# Patient Record
Sex: Female | Born: 2000 | Race: White | Hispanic: No | Marital: Single | Smoking: Never smoker
Health system: Southern US, Community
[De-identification: ages and names within clinical notes are randomized; demographics above are authoritative.]

## PROBLEM LIST (undated history)

## (undated) DIAGNOSIS — J45909 Unspecified asthma, uncomplicated: Secondary | ICD-10-CM

## (undated) DIAGNOSIS — T7840XA Allergy, unspecified, initial encounter: Secondary | ICD-10-CM

## (undated) DIAGNOSIS — D649 Anemia, unspecified: Secondary | ICD-10-CM

## (undated) DIAGNOSIS — Z8619 Personal history of other infectious and parasitic diseases: Secondary | ICD-10-CM

## (undated) HISTORY — DX: Anemia, unspecified: D64.9

## (undated) HISTORY — DX: Allergy, unspecified, initial encounter: T78.40XA

## (undated) HISTORY — DX: Personal history of other infectious and parasitic diseases: Z86.19

---
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
## 2021-01-22 ENCOUNTER — Ambulatory Visit (INDEPENDENT_AMBULATORY_CARE_PROVIDER_SITE_OTHER): Payer: BC Managed Care – PPO | Admitting: Physician Assistant

## 2021-01-22 ENCOUNTER — Other Ambulatory Visit: Payer: Self-pay

## 2021-01-22 ENCOUNTER — Encounter: Payer: Self-pay | Admitting: Physician Assistant

## 2021-01-22 VITALS — BP 107/72 | HR 67 | Temp 98.0°F | Ht 63.0 in | Wt 128.2 lb

## 2021-01-22 DIAGNOSIS — Z30011 Encounter for initial prescription of contraceptive pills: Secondary | ICD-10-CM

## 2021-01-22 MED ORDER — NORETHINDRONE ACET-ETHINYL EST 1-20 MG-MCG PO TABS: 1.0000 | ORAL_TABLET | Freq: Every day | ORAL | 11 refills | Status: DC

## 2021-01-22 NOTE — Progress Notes (Signed)
Subjective:    Patient ID: Carol Brown, female    DOB: [birth year], 20 y.o.   MRN: [id number]  Chief Complaint  Patient presents with   Establish Care    HPI 20 y.o. patient presents today for new patient establishment with me.  Patient was previously established with Dr. Artis Flock. 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 Studying business Merchant navy officer.   Current Care Team: No specialists   Acute Concerns: Interested in starting on birth control pill. She is not sexually active at this time. LMP was 01/08/21. No irregularity. No major issues with periods. Does not smoke.   Chronic Concerns: No major medical history    History reviewed. No pertinent past medical history.  History reviewed. No pertinent surgical history.  History reviewed. No pertinent family history.  Social History   Tobacco Use   Smoking status: Never   Smokeless tobacco: Never  Substance Use Topics   Alcohol use: Never   Drug use: Never     No Known Allergies  Review of Systems NEGATIVE UNLESS OTHERWISE INDICATED IN HPI      Objective:     BP 107/72    Pulse 67    Temp 98 F (36.7 C)    Ht 5\' 3"  (1.6 m)    Wt 128 lb 3.2 oz (58.2 kg)    LMP 01/08/2021    SpO2 99%    BMI 22.71 kg/m   Wt Readings from Last 3 Encounters:  01/22/21 128 lb 3.2 oz (58.2 kg) (52 %, Z= 0.04)*  06/26/20 119 lb 9.6 oz (54.3 kg) (37 %, Z= -0.33)*   * Growth percentiles are based on CDC (Girls, 2-20 Years) data.    BP Readings from Last 3 Encounters:  01/22/21 107/72  06/26/20 107/74     Physical Exam Vitals and nursing note reviewed.  Constitutional:      Appearance: Normal appearance. She is normal weight. She is not toxic-appearing.  HENT:     Head: Normocephalic and atraumatic.     Right Ear: External ear normal.     Left Ear: External ear normal.     Nose: Nose normal.     Mouth/Throat:     Mouth: Mucous membranes are moist.  Eyes:     Extraocular Movements: Extraocular  movements intact.     Conjunctiva/sclera: Conjunctivae normal.     Pupils: Pupils are equal, round, and reactive to light.  Cardiovascular:     Rate and Rhythm: Normal rate and regular rhythm.     Pulses: Normal pulses.     Heart sounds: Normal heart sounds.  Pulmonary:     Effort: Pulmonary effort is normal.     Breath sounds: Normal breath sounds.  Musculoskeletal:        General: Normal range of motion.     Cervical back: Normal range of motion and neck supple.  Skin:    General: Skin is warm and dry.  Neurological:     General: No focal deficit present.     Mental Status: She is alert and oriented to person, place, and time.  Psychiatric:        Mood and Affect: Mood normal.        Behavior: Behavior normal.        Thought Content: Thought content normal.        Judgment: Judgment normal.       Assessment & Plan:   Problem List Items Addressed This Visit   None Visit Diagnoses  Oral contraception initiation    -  Primary        Meds ordered this encounter  Medications   norethindrone-ethinyl estradiol (LOESTRIN 1/20, 21,) 1-20 MG-MCG tablet    Sig: Take 1 tablet by mouth daily.    Dispense:  28 tablet    Refill:  11   1. Oral contraception initiation -Several options discussed with patient including IUD, depo shot, Nexplanon, and OCPs. -R/B/A discussed with patient. Possible SE also discussed. -Agreed to start on Loestrin. -Reminded her not to miss doses. Reminded her this does not protect against STIs.  -An After Visit Summary was printed and given to the patient. -F/up in 1 year for labs / CPE / med check ; or prn.    Rithik Odea M Kenedee Molesky, PA-C

## 2021-01-22 NOTE — Patient Instructions (Signed)
Good to meet you today! Start on the Loestrin this upcoming Sunday. Take daily at the same time. Do not miss doses. See handout provided. Call / Mychart if any concerns.

## 2021-05-10 DIAGNOSIS — J209 Acute bronchitis, unspecified: Secondary | ICD-10-CM | POA: Diagnosis not present

## 2021-05-10 DIAGNOSIS — J45998 Other asthma: Secondary | ICD-10-CM | POA: Diagnosis not present

## 2021-07-03 ENCOUNTER — Telehealth: Payer: Self-pay | Admitting: Physician Assistant

## 2021-07-03 ENCOUNTER — Encounter: Payer: Self-pay | Admitting: Family Medicine

## 2021-07-03 ENCOUNTER — Emergency Department (HOSPITAL_BASED_OUTPATIENT_CLINIC_OR_DEPARTMENT_OTHER): Payer: BC Managed Care – PPO

## 2021-07-03 ENCOUNTER — Encounter (HOSPITAL_BASED_OUTPATIENT_CLINIC_OR_DEPARTMENT_OTHER): Payer: Self-pay

## 2021-07-03 ENCOUNTER — Other Ambulatory Visit: Payer: Self-pay

## 2021-07-03 ENCOUNTER — Emergency Department (HOSPITAL_BASED_OUTPATIENT_CLINIC_OR_DEPARTMENT_OTHER)
Admission: EM | Admit: 2021-07-03 | Discharge: 2021-07-03 | Disposition: A | Discharge status: Home / Self Care | Payer: BC Managed Care – PPO | Attending: Emergency Medicine | Admitting: Emergency Medicine

## 2021-07-03 ENCOUNTER — Ambulatory Visit (INDEPENDENT_AMBULATORY_CARE_PROVIDER_SITE_OTHER): Payer: BC Managed Care – PPO | Admitting: Family Medicine

## 2021-07-03 VITALS — BP 102/76 | HR 100 | Temp 98.3°F | Ht 63.0 in | Wt 132.1 lb

## 2021-07-03 DIAGNOSIS — R1084 Generalized abdominal pain: Secondary | ICD-10-CM | POA: Diagnosis not present

## 2021-07-03 DIAGNOSIS — A084 Viral intestinal infection, unspecified: Secondary | ICD-10-CM | POA: Diagnosis not present

## 2021-07-03 DIAGNOSIS — R109 Unspecified abdominal pain: Secondary | ICD-10-CM | POA: Diagnosis not present

## 2021-07-03 DIAGNOSIS — R000 Tachycardia, unspecified: Secondary | ICD-10-CM | POA: Insufficient documentation

## 2021-07-03 DIAGNOSIS — R1031 Right lower quadrant pain: Secondary | ICD-10-CM | POA: Diagnosis not present

## 2021-07-03 DIAGNOSIS — R1030 Lower abdominal pain, unspecified: Secondary | ICD-10-CM

## 2021-07-03 LAB — POCT URINALYSIS DIPSTICK
Bilirubin, UA: POSITIVE
Blood, UA: NEGATIVE
Glucose, UA: NEGATIVE
Ketones, UA: POSITIVE
Nitrite, UA: NEGATIVE
Protein, UA: POSITIVE — AB
Spec Grav, UA: 1.02 (ref 1.010–1.025)
Urobilinogen, UA: 0.2 E.U./dL
pH, UA: 6 (ref 5.0–8.0)

## 2021-07-03 LAB — URINALYSIS, ROUTINE W REFLEX MICROSCOPIC
Bilirubin Urine: NEGATIVE
Glucose, UA: NEGATIVE mg/dL
Hgb urine dipstick: NEGATIVE
Ketones, ur: 40 mg/dL — AB
Nitrite: NEGATIVE
Protein, ur: NEGATIVE mg/dL
Specific Gravity, Urine: 1.011 (ref 1.005–1.030)
pH: 6.5 (ref 5.0–8.0)

## 2021-07-03 LAB — COMPREHENSIVE METABOLIC PANEL
ALT: 9 U/L (ref 0–44)
AST: 17 U/L (ref 15–41)
Albumin: 4.7 g/dL (ref 3.5–5.0)
Alkaline Phosphatase: 39 U/L (ref 38–126)
Anion gap: 11 (ref 5–15)
BUN: 8 mg/dL (ref 6–20)
CO2: 24 mmol/L (ref 22–32)
Calcium: 9.3 mg/dL (ref 8.9–10.3)
Chloride: 102 mmol/L (ref 98–111)
Creatinine, Ser: 0.67 mg/dL (ref 0.44–1.00)
GFR, Estimated: >60 mL/min (ref >60)
Glucose, Bld: 88 mg/dL (ref 70–99)
Potassium: 3.6 mmol/L (ref 3.5–5.1)
Sodium: 137 mmol/L (ref 135–145)
Total Bilirubin: 0.5 mg/dL (ref 0.3–1.2)
Total Protein: 8 g/dL (ref 6.5–8.1)

## 2021-07-03 LAB — CBC
HCT: 39.1 % (ref 36.0–46.0)
Hemoglobin: 12.4 g/dL (ref 12.0–15.0)
MCH: 24.9 pg — ABNORMAL LOW (ref 26.0–34.0)
MCHC: 31.7 g/dL (ref 30.0–36.0)
MCV: 78.5 fL — ABNORMAL LOW (ref 80.0–100.0)
Platelets: 224 10*3/uL (ref 150–400)
RBC: 4.98 MIL/uL (ref 3.87–5.11)
RDW: 14.4 % (ref 11.5–15.5)
WBC: 8.8 10*3/uL (ref 4.0–10.5)
nRBC: 0 % (ref 0.0–0.2)

## 2021-07-03 LAB — WET PREP, GENITAL
Clue Cells Wet Prep HPF POC: NONE SEEN
Sperm: NONE SEEN
Trich, Wet Prep: NONE SEEN
WBC, Wet Prep HPF POC: >=10 — AB (ref <10)
Yeast Wet Prep HPF POC: NONE SEEN

## 2021-07-03 LAB — LIPASE, BLOOD: Lipase: 14 U/L (ref 11–51)

## 2021-07-03 LAB — POCT URINE PREGNANCY: Preg Test, Ur: NEGATIVE

## 2021-07-03 MED ORDER — KETOROLAC TROMETHAMINE 15 MG/ML IJ SOLN
15.0000 mg | Freq: Once | INTRAMUSCULAR | Status: AC
Start: 2021-07-03 — End: 2021-07-03
  Administered 2021-07-03: 15 mg via INTRAVENOUS
  Filled 2021-07-03: qty 1

## 2021-07-03 MED ORDER — IOHEXOL 300 MG/ML  SOLN
75.0000 mL | Freq: Once | INTRAMUSCULAR | Status: AC | PRN
  Administered 2021-07-03: 75 mL via INTRAVENOUS

## 2021-07-03 MED ORDER — SODIUM CHLORIDE 0.9 % IV BOLUS
1000.0000 mL | Freq: Once | INTRAVENOUS | Status: AC
  Administered 2021-07-03: 1000 mL via INTRAVENOUS

## 2021-07-03 MED ORDER — ONDANSETRON HCL 4 MG PO TABS: 4.0000 mg | ORAL_TABLET | Freq: Four times a day (QID) | ORAL | 0 refills | Status: DC

## 2021-07-03 NOTE — ED Triage Notes (Signed)
Sent by MD for CT scan to rule out appendices.  RLQ pain onset two days ago.  Fever  no vomiting.  Diarrhea.  NAD at present

## 2021-07-03 NOTE — Patient Instructions (Signed)
It was very nice to see you today!  Go to ER   PLEASE NOTE:  If you had any lab tests please let us know if you have not heard back within a few days. You may see your results on MyChart before we have a chance to review them but we will give you a call once they are reviewed by [place]. If we ordered any referrals today, please let us know if you have not heard from their office within the next week.   Please try these tips to maintain a healthy lifestyle:  Eat most of your calories during the day when you are active. Eliminate processed foods including packaged sweets (pies, cakes, cookies), reduce intake of potatoes, white bread, white pasta, and white rice. Look for whole grain options, oat flour or almond flour.  Each meal should contain half fruits/vegetables, one quarter protein, and one quarter carbs (no bigger than a computer mouse).  Cut down on sweet beverages. This includes juice, soda, and sweet tea. Also watch fruit intake, though this is a healthier sweet option, it still contains natural sugar! Limit to 3 servings daily.  Drink at least 1 glass of water with each meal and aim for at least 8 glasses per day  Exercise at least 150 minutes every week.

## 2021-07-03 NOTE — Telephone Encounter (Signed)
Patient was triaged- patient seeing [facility] 07/03/21 -   Patient Name: Carol Brown Gender: Female DOB: 09-19-2000 Age: 21 Y 10 M 13 D Return Phone Number: [phone] (Primary) Address: City/ State/ Zip: [place]  [address] Client [facility] [employer] [facility] Type Call Who Is Calling Patient / Member / Family / Caregiver Call Type Triage / Clinical Relationship To Patient Mother Return Phone Number [phone] (Primary) Chief Complaint Abdominal Pain Reason for Call Symptomatic / Request for Health Information Initial Comment Caller states her daughter has been having pain on right side by her ovary for last day and a half with diarrhea. She also has loss of appetite. She was thinking it may be an ovarian cyst or her period. Denies severe pain but it hurts while walking. Translation No Nurse Assessment Nurse: Alexander Mt, RN, Nicholaus Bloom Date/Time (Eastern Time): 07/03/2021 7:01:26 AM Confirm and document reason for call. If symptomatic, describe symptoms. ---Caller states her daughter has been having pain on right side by her ovary for last day and a half with diarrhea. She also has loss of appetite and bloating. She was thinking it may be an ovarian cyst or her period. LMP was one month ago. Pain is 8/10 at worst. Temp yesterday was 102. Pain is in the right lower quad. She has also been nauseated. Does the patient have any new or worsening symptoms? ---Yes Will a triage be completed? ---Yes Related visit to physician within the last 2 weeks? ---No Does the PT have any chronic conditions? (i.e. diabetes, asthma, this includes High risk factors for pregnancy, etc.) ---No Is the patient pregnant or possibly pregnant? (Ask all females between the ages of 61-55) ---No Is this a behavioral health or substance abuse call? ---No Guidelines Guideline Title Affirmed Question Affirmed  Notes Nurse Date/Time Lamount Cohen Time) Abdominal Pain - Female [1] MODERATE pain (e.g., interferes [place], RN, Nicholaus Bloom 07/03/2021 7:05:14 AM PLEASE NOTE: All timestamps contained within this report are represented as [place] Standard Time. CONFIDENTIALTY NOTICE: This fax transmission is intended only for the addressee. It contains information that is legally privileged, confidential or otherwise protected from use or disclosure. If you are not the intended recipient, you are strictly prohibited from reviewing, disclosing, copying using or disseminating any of this information or taking any action in reliance on or regarding this information. If you have received this fax in error, please notify us immediately by telephone so that we can arrange for its return to [place]. Phone: [phone], Toll-Free: [phone], Fax: [phone] Page: 2 of 2 Call Id: [id number] Guidelines Guideline Title Affirmed Question Affirmed Notes Nurse Date/Time Lamount Cohen Time) with normal activities) AND [2] pain comes and goes (cramps) AND [3] present > 24 hours (Exception: Pain with Vomiting or Diarrhea - see that Guideline.) Disp. Time Lamount Cohen Time) Disposition Final User 07/03/2021 7:09:00 AM See PCP within 24 Hours Yes Alexander Mt, RN, Prentiss Bells Disagree/Comply Comply Caller Understands Yes PreDisposition Go to ED Care Advice Given Per Guideline SEE PCP WITHIN 24 HOURS: * IF OFFICE WILL BE OPEN: You need to be examined within the next 24 hours. Call your doctor (or NP/PA) when the office opens and make an appointment. STOMACH CRAMPS: * Your stomach cramps may be due to an intestinal virus or from something that you ate. * When you have cramps, drink some water, then lie down and try to find a comfortable position. DIET: * Drink adequate fluids. Eat a bland diet. *  Avoid alcohol or caffeinated beverages * Avoid greasy or fatty foods. BISMUTH SUBSALICYLATE (E.G., PEPTO-BISMOL): * This medicine can help reduce  diarrhea, vomiting, and abdomen cramping. It is available over-the-counter (OTC) in a drugstore. * Adult dosage: Take two tablets or two tablespoons by mouth every hour (if diarrhea continues) to a maximum of 8 doses in a 24 hour period. CARE ADVICE given per Abdominal Pain - Female (Adult) guideline. CALL BACK IF: * You become worse * Severe pain lasts over 1 hour * Constant pain lasts over 2 hours Referrals REFERRED TO PCP OFFICE

## 2021-07-03 NOTE — Discharge Instructions (Addendum)
You were seen in the emergency department today for abdominal pain.  As we discussed your lab work and imaging all looked reassuring today.  I think that your symptoms are like related to a viral GI infection.  These can be very common, especially after traveling.  I recommend staying very well-hydrated.  I am writing you a prescription for nausea medicine called Zofran.  You can use 1 tablet under your tongue every 6 hours as needed.  I recommend over-the-counter antidiarrheal medicines like Imodium.  You can use Tylenol as needed for pain or fever.  Continue to monitor how you're doing and return to the ER for new or worsening symptoms.

## 2021-07-03 NOTE — Telephone Encounter (Signed)
Triage Note

## 2021-07-03 NOTE — ED Provider Notes (Signed)
[facility] EMERGENCY DEPT Provider Note   CSN: [id number] Arrival date & time: 07/03/21  8101     History  Chief Complaint  Patient presents with   Abdominal Pain    Carol Brown is a 21 y.o. female who presents to the emergency department for abdominal pain for the past 2 days.  Patient recently got back from a trip to the beach.  Since then she has had pain worse in her right lower quadrant with associated fever to 102F.  She had several episodes of diarrhea, no vomiting.  No urinary symptoms, pelvic pain, vaginal discharge or bleeding.  Patient went to see her primary doctor this morning, sent her to the ER for CT scan to rule out appendicitis.   Abdominal Pain Associated symptoms: diarrhea, fever and nausea   Associated symptoms: no dysuria, no hematuria, no vaginal bleeding, no vaginal discharge and no vomiting       Home Medications Prior to Admission medications   Medication Sig Start Date End Date Taking? Authorizing Provider  ondansetron (ZOFRAN) 4 MG tablet Take 1 tablet (4 mg total) by mouth every 6 (six) hours. 07/03/21  Yes Lazar Tierce T, PA-C      Allergies    Patient has no known allergies.    Review of Systems   Review of Systems  Constitutional:  Positive for appetite change and fever.  Gastrointestinal:  Positive for abdominal pain, diarrhea and nausea. Negative for blood in stool and vomiting.  Genitourinary:  Negative for dysuria, flank pain, frequency, hematuria, pelvic pain, urgency, vaginal bleeding and vaginal discharge.  All other systems reviewed and are negative.  Physical Exam Updated Vital Signs BP 114/68 (BP Location: Right Arm)   Pulse 99   Temp 98.5 F (36.9 C) (Oral)   Resp 20   Ht 5\' 3"  (1.6 m)   Wt 59 kg   LMP 06/03/2021 (Approximate)   SpO2 99%   BMI 23.03 kg/m  Physical Exam Vitals and nursing note reviewed. Exam conducted with a chaperone present.  Constitutional:      Appearance: Normal appearance.   HENT:     Head: Normocephalic and atraumatic.  Eyes:     Conjunctiva/sclera: Conjunctivae normal.  Cardiovascular:     Rate and Rhythm: Normal rate and regular rhythm.  Pulmonary:     Effort: Pulmonary effort is normal. No respiratory distress.     Breath sounds: Normal breath sounds.  Abdominal:     General: There is no distension.     Palpations: Abdomen is soft.     Tenderness: There is generalized abdominal tenderness and tenderness in the right lower quadrant. There is guarding. There is no right CVA tenderness, left CVA tenderness or rebound.  Genitourinary:    General: Normal vulva.     Exam position: Lithotomy position.     Vagina: Normal.     Cervix: No cervical motion tenderness.     Uterus: Normal.      Adnexa: Right adnexa normal and left adnexa normal.       Right: No mass, tenderness or fullness.         Left: No mass, tenderness or fullness.       Comments: Minimal white cervical discharge Skin:    General: Skin is warm and dry.  Neurological:     General: No focal deficit present.     Mental Status: She is alert.    ED Results / Procedures / Treatments   Labs (all labs ordered are listed, but only  abnormal results are displayed) Labs Reviewed  WET PREP, GENITAL - Abnormal; Notable for the following components:      Result Value   WBC, Wet Prep HPF POC >=10 (*)    All other components within normal limits  CBC - Abnormal; Notable for the following components:   MCV 78.5 (*)    MCH 24.9 (*)    All other components within normal limits  URINALYSIS, ROUTINE W REFLEX MICROSCOPIC - Abnormal; Notable for the following components:   Ketones, ur 40 (*)    Leukocytes,Ua SMALL (*)    Bacteria, UA RARE (*)    All other components within normal limits  LIPASE, BLOOD  COMPREHENSIVE METABOLIC PANEL    EKG None  Radiology CT ABDOMEN PELVIS W CONTRAST  Result Date: 07/03/2021 CLINICAL DATA:  RLQ abdominal pain (Age >= 14y) EXAM: CT ABDOMEN AND PELVIS WITH  CONTRAST TECHNIQUE: Multidetector CT imaging of the abdomen and pelvis was performed using the standard protocol following bolus administration of intravenous contrast. RADIATION DOSE REDUCTION: This exam was performed according to the departmental dose-optimization program which includes automated exposure control, adjustment of the mA and/or kV according to patient size and/or use of iterative reconstruction technique. CONTRAST:  45mL OMNIPAQUE IOHEXOL 300 MG/ML  SOLN COMPARISON:  None Available. FINDINGS: Lower chest: No acute abnormality. Hepatobiliary: No focal liver abnormality is seen. No gallstones, gallbladder wall thickening, or biliary dilatation. Pancreas: Unremarkable. No pancreatic ductal dilatation or surrounding inflammatory changes. Spleen: Normal in size without focal abnormality. Adrenals/Urinary Tract: Adrenals and kidneys are unremarkable. Bladder is poorly distended but appears unremarkable. Stomach/Bowel: Stomach is within normal limits. Bowel is normal in caliber. A normal appendix is identified. Vascular/Lymphatic: No significant vascular abnormality. Few prominent and mildly enlarged right mesenteric lymph nodes. Largest measures 1.6 cm. Reproductive: Uterus and bilateral adnexa are unremarkable. Other: Small volume free fluid in the dependent pelvis is likely physiologic. Musculoskeletal: Minor degenerative disc disease at L5-S1. IMPRESSION: Normal appendix. Mild right lower quadrant mesenteric adenopathy is probably reactive. Electronically Signed   By: Guadlupe Spanish M.D.   On: 07/03/2021 12:09    Procedures Procedures    Medications Ordered in ED Medications  ketorolac (TORADOL) 15 MG/ML injection 15 mg (15 mg Intravenous Given 07/03/21 1151)  sodium chloride 0.9 % bolus 1,000 mL (0 mLs Intravenous Stopped 07/03/21 1317)  iohexol (OMNIPAQUE) 300 MG/ML solution 75 mL (75 mLs Intravenous Contrast Given 07/03/21 1154)    ED Course/ Medical Decision Making/ A&P                            Medical Decision Making Amount and/or Complexity of Data Reviewed Labs: ordered. Radiology: ordered.  Risk Prescription drug management.   This patient is a 21 y.o. female who presents to the ED for concern of abdominal pain, this involves an extensive number of treatment options, and is a complaint that carries with it a high risk of complications and morbidity. The emergent differential diagnosis prior to evaluation includes, but is not limited to,  Pelvic inflammatory disease, ectopic pregnancy, appendicitis, urinary calculi, primary dysmenorrhea, septic abortion, ruptured ovarian cyst or tumor, ovarian torsion, tubo-ovarian abscess, degeneration of fibroid, endometriosis, diverticulitis, cystitis. This is not an exhaustive differential.   Past Medical History / Co-morbidities / Social History: History reviewed. No pertinent past medical history.  Additional history: Chart reviewed. Pertinent results include: Patient seen at PCP this morning, who had concern for appendicitis and recommended she go to the ER  for scan. Had urinalysis and urine pregnancy done at that time that were normal.  Physical Exam: Physical exam performed. The pertinent findings include: Afebrile, tachycardic in the 120s.  Abdomen soft, generalized tenderness, worse in the right lower quadrant with mild guarding.  No rebound.  GU exam performed with chaperone with no cervical motion tenderness or significant adnexal tenderness or fullness.  Lab Tests: I ordered, and personally interpreted labs.  The pertinent results include: No leukocytosis, normal hemoglobin.  Electrolytes within normal limits, normal kidney function.  Urinalysis with small leukocytes, negative for hematuria.  Wet prep negative.   Imaging Studies: I ordered imaging studies including CT abdomen pelvis. I independently visualized and interpreted imaging which showed no acute intra-abdominal pathology with mildly enlarged right mesenteric lymph  node thought to be reactive. I agree with the radiologist interpretation.   Medications: I ordered medication including IV fluids and Toradol for abdominal pain. Reevaluation of the patient after these medicines showed that the patient improved. I have reviewed the patients home medicines and have made adjustments as needed.  Disposition: After consideration of the diagnostic results and the patients response to treatment, I feel that patient's not requiring admission or inpatient treatment for her symptoms.  I had greatest concern for appendicitis versus ovarian pathology.  Patient had a normal CT scan.  After my pelvic exam, without significant adnexal tenderness, I do not think patient was requiring a vaginal ultrasound.  I think her symptoms are likely related to a viral gastroenteritis as she recently was traveling.  Will prescribe Zofran, encourage good fluid intake and over-the-counter gas/antidiarrheals.  We discussed reasons to return to the emergency department, and both patient and her mother are agreeable to the plan.  I discussed this case with my attending physician Dr. Particia NearingHaviland who cosigned this note including patient's presenting symptoms, physical exam, and planned diagnostics and interventions. Attending physician stated agreement with plan or made changes to plan which were implemented.     Final Clinical Impression(s) / ED Diagnoses Final diagnoses:  Lower abdominal pain  Viral gastroenteritis    Rx / DC Orders ED Discharge Orders          Ordered    ondansetron (ZOFRAN) 4 MG tablet  Every 6 hours        07/03/21 1419           Portions of this report may have been transcribed using voice recognition software. Every effort was made to ensure accuracy; however, inadvertent computerized transcription errors may be present.    Jeanella FlatteryRoemhildt, Keirstan Iannello T, PA-C 07/03/21 1512    Jacalyn LefevreHaviland, Julie, MD 07/03/21 [phone]

## 2021-07-03 NOTE — Progress Notes (Signed)
Subjective:     Patient ID: Carol Brown, female    DOB: March 14, 2000, 21 y.o.   MRN: [id number]  Chief Complaint  Patient presents with   Abdominal Pain    Feeling bloated, tender to the touch, lower right side Sharp pain, started 2 days ago     Fever    Yesterday was 102   Diarrhea    Started 2 days ago, having diarrhea and a lot of gas    HPI-here w/mom RLQ pain for 2 days-sharp.intermitt.  would hurt to touch it yesterday and sitting. 9/10 yesterday. Bloated.  Diarrhea-4x yesterday.  No blood.  Hurts to walk. Temp 102 yesterday.  Decreased appetite. Even fluids make her feel "bad".  About to start period-never had pain, etc.  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 .yesterday, hot and dizzy and chills.  Stopped ocp 1 mo ago.   Good UO.   Going over bumps driving painful.  Health Maintenance Due  Topic Date Due   HIV Screening  Never done   Hepatitis C Screening  Never done    History reviewed. No pertinent past medical history.  History reviewed. No pertinent surgical history.  Outpatient Medications Prior to Visit  Medication Sig Dispense Refill   albuterol (VENTOLIN HFA) 108 (90 Base) MCG/ACT inhaler Inhale 2 puffs into the lungs every 4 (four) hours as needed.     levocetirizine (XYZAL) 5 MG tablet SMARTSIG:1 Tablet(s) By Mouth Every Evening     norethindrone-ethinyl estradiol (LOESTRIN 1/20, 21,) 1-20 MG-MCG tablet Take 1 tablet by mouth daily. (Patient not taking: Reported on 07/03/2021) 28 tablet 11   No facility-administered medications prior to visit.    No Known Allergies ROS neg/noncontributory except as noted HPI/below      Objective:     BP 102/76   Pulse 100   Temp 98.3 F (36.8 C) (Temporal)   Ht 5\' 3"  (1.6 m)   Wt 132 lb 2 oz (59.9 kg)   LMP 06/03/2021 (Approximate)   SpO2 99%   BMI 23.40 kg/m  Wt Readings from Last 3 Encounters:  07/03/21 132 lb 2 oz (59.9 kg) (57 %, Z= 0.18)*  01/22/21 128 lb 3.2 oz (58.2 kg) (52 %, Z= 0.04)*  06/26/20 119 lb  9.6 oz (54.3 kg) (37 %, Z= -0.33)*   * Growth percentiles are based on CDC (Girls, 2-20 Years) data.    Physical Exam   Gen: WDWN NAD. sweaty HEENT: NCAT, conjunctiva not injected, sclera nonicteric NECK:  supple, no thyromegaly, no nodes, no carotid bruits CARDIAC: tachyRRR, S1S2+, no murmur. DP 2+B LUNGS: CTAB. No wheezes ABDOMEN:  BS+, soft, +mod tender RLQ, LLQ No HSM, no masses. No CVAT EXT:  no edema MSK: no gross abnormalities.  NEURO: A&O x3.  CN II-XII intact.  PSYCH: normal mood. Good eye contact  Results for orders placed or performed in visit on 07/03/21  POCT urinalysis dipstick  Result Value Ref Range   Color, UA yellow    Clarity, UA dark    Glucose, UA Negative Negative   Bilirubin, UA positive    Ketones, UA positive    Spec Grav, UA 1.020 1.010 - 1.025   Blood, UA neg    pH, UA 6.0 5.0 - 8.0   Protein, UA Positive (A) Negative   Urobilinogen, UA 0.2 0.2 or 1.0 E.U./dL   Nitrite, UA neg    Leukocytes, UA Trace (A) Negative   Appearance     Odor    POCT urine pregnancy  Result  Value Ref Range   Preg Test, Ur Negative Negative        Assessment & Plan:   Problem List Items Addressed This Visit   None Visit Diagnoses     Generalized abdominal pain    -  Primary   Relevant Orders   POCT urinalysis dipstick (Completed)   POCT urine pregnancy (Completed)      Abd pain-fever yesterday, no appetite. Tachycardia, sweath.  Concern for appy.  Could be viral, ovarian cyst, other.  To ER.    No orders of the defined types were placed in this encounter.   Angelena Sole, MD

## 2021-07-03 NOTE — ED Notes (Signed)
Discharge instructions, follow up care, and prescriptions reviewed and explained, pt verbalized understanding. Pt caox4 and ambulatory on departure.  

## 2021-07-03 NOTE — ED Notes (Signed)
Pelvic cart at the bedside 

## 2021-07-03 NOTE — ED Notes (Signed)
Patient transported to CT 

## 2021-07-03 NOTE — Telephone Encounter (Signed)
Patient was seen and then sent to ER by Dr. Cherlynn Kaiser.

## 2021-07-09 ENCOUNTER — Ambulatory Visit (INDEPENDENT_AMBULATORY_CARE_PROVIDER_SITE_OTHER): Payer: BC Managed Care – PPO | Admitting: Physician Assistant

## 2021-07-09 ENCOUNTER — Encounter: Payer: Self-pay | Admitting: Physician Assistant

## 2021-07-09 VITALS — BP 107/73 | HR 98 | Temp 98.1°F | Ht 63.5 in | Wt 130.8 lb

## 2021-07-09 DIAGNOSIS — R140 Abdominal distension (gaseous): Secondary | ICD-10-CM

## 2021-07-09 DIAGNOSIS — Z1329 Encounter for screening for other suspected endocrine disorder: Secondary | ICD-10-CM

## 2021-07-09 DIAGNOSIS — Z8349 Family history of other endocrine, nutritional and metabolic diseases: Secondary | ICD-10-CM | POA: Diagnosis not present

## 2021-07-09 DIAGNOSIS — Z23 Encounter for immunization: Secondary | ICD-10-CM

## 2021-07-09 NOTE — Progress Notes (Unsigned)
   Subjective:    Patient ID: Carol Brown, female    DOB: May 10, 2000, 21 y.o.   MRN: [id number]  Chief Complaint  Patient presents with   Discuss Tests for Graves    Pt came in to discuss possible lab work for Graves and Celiac disease; pt has family history and had some issues last week and wanting to make sure before leaving to study abroad.     HPI Patient is in today requesting labs for possible thyroid or Celiac concerns. Here with mom.  "Always has had issues with stomach." -Bloated, trouble using the bathroom sometimes -Used to take probiotics -Gluten seems to be triggering issues; gluten-free is helping right now; dairy alternatives -Mom has Grave's disease -First cousin with celiac disease -Last week in ED - diagnosed with viral illness, CT showed mesenteric adenopathy  Leaving to study abroad for about one month   No past medical history on file.  No past surgical history on file.  No family history on file.  Social History   Tobacco Use   Smoking status: Never   Smokeless tobacco: Never  Substance Use Topics   Alcohol use: Never   Drug use: Never     No Known Allergies  Review of Systems NEGATIVE UNLESS OTHERWISE INDICATED IN HPI      Objective:     BP 107/73 (BP Location: Left Arm)   Pulse 98   Temp 98.1 F (36.7 C) (Temporal)   Ht 5' 3.5" (1.613 m)   Wt 130 lb 12.8 oz (59.3 kg)   LMP 07/05/2021 (Exact Date)   SpO2 100%   BMI 22.81 kg/m   Wt Readings from Last 3 Encounters:  07/09/21 130 lb 12.8 oz (59.3 kg) (55 %, Z= 0.12)*  07/03/21 130 lb (59 kg) (53 %, Z= 0.08)*  07/03/21 132 lb 2 oz (59.9 kg) (57 %, Z= 0.18)*   * Growth percentiles are based on CDC (Girls, 2-20 Years) data.    BP Readings from Last 3 Encounters:  07/09/21 107/73  07/03/21 114/68  07/03/21 102/76     Physical Exam     Assessment & Plan:   Problem List Items Addressed This Visit   None    No orders of the defined types were placed in this  encounter.    No follow-ups on file.  This note was prepared with assistance of Conservation officer, historic buildings. Occasional wrong-word or sound-a-like substitutions may have occurred due to the inherent limitations of voice recognition software.  Time Spent: *** minutes of total time was spent on the date of the encounter performing the following actions: chart review prior to seeing the patient, obtaining history, performing a medically necessary exam, counseling on the treatment plan, placing orders, and documenting in our EHR.       Steel Kerney M Gabriel Conry, PA-C

## 2021-07-09 NOTE — Patient Instructions (Signed)
Good to see you today! These tests do take some time to come back - usually a week or so. Consider GI referral if worse or no improvement of symptoms.  Have fun this summer!   PLEASE NOTE:  If you had any LAB tests please let us know if you have not heard back within a few days. You may see your results on MyChart before we have a chance to review them but we will give you a call once they are reviewed by [place]. If we ordered any REFERRALS today, please let us know if you have not heard from their office within the next two weeks. Let us know through MyChart if you are needing REFILLS, or have your pharmacy send [place] the request. You can also use MyChart to communicate with me or any office staff.  Please try these tips to maintain a healthy lifestyle:  Eat most of your calories during the day when you are active. Eliminate processed foods including packaged sweets (pies, cakes, cookies), reduce intake of potatoes, white bread, white pasta, and white rice. Look for whole grain options, oat flour or almond flour.  Each meal should contain half fruits/vegetables, one quarter protein, and one quarter carbs (no bigger than a computer mouse).  Cut down on sweet beverages. This includes juice, soda, and sweet tea. Also watch fruit intake, though this is a healthier sweet option, it still contains natural sugar! Limit to 3 servings daily.  Drink at least 1 glass of water with each meal and aim for at least 8 glasses (64 ounces) per day.  Exercise at least 150 minutes every week to the best of your ability.    Take Care,  Rogina Schiano, PA-C

## 2021-07-11 LAB — THYROID PANEL WITH TSH
Free Thyroxine Index: 2 (ref 1.4–3.8)
T3 Uptake: 33 % (ref 22–35)
T4, Total: 6.1 ug/dL (ref 5.3–11.7)
TSH: 0.89 mIU/L

## 2021-07-11 LAB — TRAB (TSH RECEPTOR BINDING ANTIBODY): TRAB: <1 IU/L (ref <=2.00)

## 2021-07-11 LAB — GLIADIN ANTIBODIES, SERUM
Gliadin IgA: 3 U/mL
Gliadin IgG: 4.4 U/mL

## 2021-07-11 LAB — TISSUE TRANSGLUTAMINASE, IGA: (tTG) Ab, IgA: <1 U/mL

## 2021-07-11 LAB — RETICULIN ANTIBODIES, IGA W TITER: Reticulin Ab, IgA: NEGATIVE titer (ref <2.5)

## 2021-09-27 DIAGNOSIS — J301 Allergic rhinitis due to pollen: Secondary | ICD-10-CM | POA: Diagnosis not present

## 2021-09-27 DIAGNOSIS — J45990 Exercise induced bronchospasm: Secondary | ICD-10-CM | POA: Diagnosis not present

## 2021-09-27 DIAGNOSIS — J3089 Other allergic rhinitis: Secondary | ICD-10-CM | POA: Diagnosis not present

## 2021-09-27 DIAGNOSIS — J3081 Allergic rhinitis due to animal (cat) (dog) hair and dander: Secondary | ICD-10-CM | POA: Diagnosis not present

## 2021-10-29 ENCOUNTER — Encounter: Payer: Self-pay | Admitting: *Deleted

## 2021-11-29 DIAGNOSIS — H66003 Acute suppurative otitis media without spontaneous rupture of ear drum, bilateral: Secondary | ICD-10-CM | POA: Diagnosis not present

## 2021-11-29 DIAGNOSIS — H109 Unspecified conjunctivitis: Secondary | ICD-10-CM | POA: Diagnosis not present

## 2022-01-17 ENCOUNTER — Encounter: Payer: Self-pay | Admitting: *Deleted

## 2022-04-11 DIAGNOSIS — H66003 Acute suppurative otitis media without spontaneous rupture of ear drum, bilateral: Secondary | ICD-10-CM | POA: Diagnosis not present

## 2022-04-11 DIAGNOSIS — J0100 Acute maxillary sinusitis, unspecified: Secondary | ICD-10-CM | POA: Diagnosis not present

## 2022-07-13 DIAGNOSIS — H66003 Acute suppurative otitis media without spontaneous rupture of ear drum, bilateral: Secondary | ICD-10-CM | POA: Diagnosis not present

## 2022-07-13 DIAGNOSIS — Z6822 Body mass index (BMI) 22.0-22.9, adult: Secondary | ICD-10-CM | POA: Diagnosis not present

## 2022-07-25 ENCOUNTER — Encounter: Payer: Self-pay | Admitting: Physician Assistant

## 2022-07-25 ENCOUNTER — Ambulatory Visit (INDEPENDENT_AMBULATORY_CARE_PROVIDER_SITE_OTHER): Payer: BC Managed Care – PPO | Admitting: Physician Assistant

## 2022-07-25 VITALS — BP 110/76 | HR 117 | Temp 98.2°F | Ht 63.5 in | Wt 126.4 lb

## 2022-07-25 DIAGNOSIS — F419 Anxiety disorder, unspecified: Secondary | ICD-10-CM

## 2022-07-25 DIAGNOSIS — R002 Palpitations: Secondary | ICD-10-CM

## 2022-07-25 LAB — COMPREHENSIVE METABOLIC PANEL
ALT: 11 U/L (ref 0–35)
AST: 19 U/L (ref 0–37)
Albumin: 4.9 g/dL (ref 3.5–5.2)
Alkaline Phosphatase: 53 U/L (ref 39–117)
BUN: 11 mg/dL (ref 6–23)
CO2: 24 mEq/L (ref 19–32)
Calcium: 9.6 mg/dL (ref 8.4–10.5)
Chloride: 102 mEq/L (ref 96–112)
Creatinine, Ser: 0.77 mg/dL (ref 0.40–1.20)
GFR: 110.65 mL/min (ref >60.00)
Glucose, Bld: 96 mg/dL (ref 70–99)
Potassium: 3.8 mEq/L (ref 3.5–5.1)
Sodium: 137 mEq/L (ref 135–145)
Total Bilirubin: 0.6 mg/dL (ref 0.2–1.2)
Total Protein: 8.1 g/dL (ref 6.0–8.3)

## 2022-07-25 LAB — CBC WITH DIFFERENTIAL/PLATELET
Basophils Absolute: 0 10*3/uL (ref 0.0–0.1)
Basophils Relative: 0.5 % (ref 0.0–3.0)
Eosinophils Absolute: 0.1 10*3/uL (ref 0.0–0.7)
Eosinophils Relative: 0.7 % (ref 0.0–5.0)
HCT: 42.5 % (ref 36.0–46.0)
Hemoglobin: 14 g/dL (ref 12.0–15.0)
Lymphocytes Relative: 14.5 % (ref 12.0–46.0)
Lymphs Abs: 1.2 10*3/uL (ref 0.7–4.0)
MCHC: 33 g/dL (ref 30.0–36.0)
MCV: 82.7 fl (ref 78.0–100.0)
Monocytes Absolute: 0.6 10*3/uL (ref 0.1–1.0)
Monocytes Relative: 7.9 % (ref 3.0–12.0)
Neutro Abs: 6.2 10*3/uL (ref 1.4–7.7)
Neutrophils Relative %: 76.4 % (ref 43.0–77.0)
Platelets: 245 10*3/uL (ref 150.0–400.0)
RBC: 5.14 Mil/uL — ABNORMAL HIGH (ref 3.87–5.11)
RDW: 15.2 % — ABNORMAL HIGH (ref 11.5–14.6)
WBC: 8.1 10*3/uL (ref 4.5–10.5)

## 2022-07-25 MED ORDER — HYDROXYZINE HCL 10 MG PO TABS: 10.0000 mg | ORAL_TABLET | Freq: Three times a day (TID) | ORAL | 0 refills | Status: DC | PRN

## 2022-07-25 NOTE — Progress Notes (Signed)
Subjective:    Patient ID: Carol Brown, female    DOB: 10-18-2000, 21 y.o.   MRN: [id number]  Chief Complaint  Patient presents with   Anxiety    Pt in office c/o racing heart rate and thinks it could be anxiety related. Noticed when traveling abroad to [place]. Brain fog, frequent headaches, pressure in head per pt; hard to sleep, hands getting sweaty, pressure in the ears, per pt feels like everything just flipped in her life/body and now its just different.     Anxiety     Patient is in today for heart racing and anxiety.  [place] x 3.5 weeks, (36 hour trip) returned on June 6th, and then went to [place], now back again. No close friends on this trip. Went on this trip with 32 other people, but struggled with being her true-self. 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 States she is a people-pleaser and used to stretching herself thin.   Doesn't eat gluten or dairy. Thought she ate something bad there.  Head felt pressure pain. Brain fog. Heart was racing occasionally. Very anxious and overwhelmed feelings. States she's never felt this way before. Thought things would resolve when she got back, and still having symptoms.  High-stress kind of year. Thinks it has just piled up and led to how she's feeling now.   Aunt (59) suddenly passed two weeks prior to [place].   Denies any chest pain or shortness of breath. No dizziness. No calf pain or redness. Normal activities, no trouble with exertion.   No past medical history on file.  No past surgical history on file.  No family history on file.  Social History   Tobacco Use   Smoking status: Never   Smokeless tobacco: Never  Substance Use Topics   Alcohol use: Never   Drug use: Never     No Known Allergies  Review of Systems NEGATIVE UNLESS OTHERWISE INDICATED IN HPI      Objective:     BP 110/76 (BP Location: Left Arm)   Pulse (!) 117   Temp 98.2 F (36.8 C) (Temporal)   Ht 5' 3.5"  (1.613 m)   Wt 126 lb 6.4 oz (57.3 kg)   LMP 06/24/2022 (Approximate)   SpO2 99%   BMI 22.04 kg/m   Wt Readings from Last 3 Encounters:  07/25/22 126 lb 6.4 oz (57.3 kg)  07/09/21 130 lb 12.8 oz (59.3 kg) (55 %, Z= 0.12)*  07/03/21 130 lb (59 kg) (53 %, Z= 0.08)*   * Growth percentiles are based on CDC (Girls, 2-20 Years) data.    BP Readings from Last 3 Encounters:  07/25/22 110/76  07/09/21 107/73  07/03/21 114/68     Physical Exam Vitals and nursing note reviewed.  Constitutional:      Appearance: Normal appearance.  Cardiovascular:     Rate and Rhythm: Normal rate and regular rhythm.     Heart sounds: No murmur heard.    Comments: Tachycardia at first, and if she started to cry, otherwise HR in the 80s with rest.  Pulmonary:     Effort: Pulmonary effort is normal.     Breath sounds: Normal breath sounds.  Musculoskeletal:     Right lower leg: No edema.     Left lower leg: No edema.  Skin:    General: Skin is warm.     Findings: No rash.  Neurological:     General: No focal deficit present.  Mental Status: She is alert and oriented to person, place, and time.     Motor: No weakness.  Psychiatric:     Comments: Very anxious and tearful, talking fast        Assessment & Plan:  Palpitations -     CBC with Differential/Platelet -     Comprehensive metabolic panel -     Thyroid Panel With TSH  Anxiety -     CBC with Differential/Platelet -     Comprehensive metabolic panel -     Thyroid Panel With TSH -     hydrOXYzine HCl; Take 1 tablet (10 mg total) by mouth 3 (three) times daily as needed for anxiety.  Dispense: 30 tablet; Refill: 0   Reassured patient I think this is grief / anxiety / overwhelm driving her heart racing symptoms. Considered PE at first, but HR normal on exam, and she's not had any SOB or CP or trouble with exertion. Will check a few labs per patient request today. Trial hydroxyzine as directed. Counseling upcoming next month. Close  f/up with me next week. ER if acutely worse this weekend.     Return in about 1 week (around 08/01/2022) for recheck/follow-up.    Sheriden Archibeque M Shae Augello, PA-C

## 2022-07-25 NOTE — Patient Instructions (Signed)
Labs today  Keep your counseling appointment, see if you can find anything sooner available.  Start on hydroxyzine to take up to three times daily for anxiety as needed. This may make you tired.  Call sooner if worse or any change in symptoms, I will see you back next week though.

## 2022-07-26 LAB — THYROID PANEL WITH TSH
Free Thyroxine Index: 2.1 (ref 1.4–3.8)
T3 Uptake: 31 % (ref 22–35)
T4, Total: 6.7 ug/dL (ref 5.3–11.7)
TSH: 0.56 mIU/L

## 2022-08-01 ENCOUNTER — Encounter: Payer: Self-pay | Admitting: Physician Assistant

## 2022-08-01 ENCOUNTER — Ambulatory Visit (INDEPENDENT_AMBULATORY_CARE_PROVIDER_SITE_OTHER): Payer: BC Managed Care – PPO | Admitting: Physician Assistant

## 2022-08-01 VITALS — BP 109/72 | HR 72 | Temp 97.8°F | Ht 63.5 in | Wt 125.8 lb

## 2022-08-01 DIAGNOSIS — R002 Palpitations: Secondary | ICD-10-CM | POA: Diagnosis not present

## 2022-08-01 DIAGNOSIS — F419 Anxiety disorder, unspecified: Secondary | ICD-10-CM | POA: Diagnosis not present

## 2022-08-01 NOTE — Progress Notes (Signed)
   Subjective:    Patient ID: Carol Brown, female    DOB: 26-Jul-2000, 21 y.o.   MRN: [id number]  Chief Complaint  Patient presents with   Follow-up    1 week follow , she has had any more palpitations, no new concerns     HPI Patient is in today for one week f/up. Doing better. Hasn't taken hydroxyzine in the last two days. She took more time to rest this last week. She will start counseling July 9th. Took hydroxyzine for about 5 days in a row, then was able to relax and wean down from it. It did help her anxiety quite a bit. She's looking forward to a few days of work in July and some beach trips. No new stressors or concerns. No further palpitations.   History reviewed. No pertinent past medical history.  History reviewed. No pertinent surgical history.  History reviewed. No pertinent family history.  Social History   Tobacco Use   Smoking status: Never   Smokeless tobacco: Never  Substance Use Topics   Alcohol use: Never   Drug use: Never     No Known Allergies  Review of Systems NEGATIVE UNLESS OTHERWISE INDICATED IN HPI      Objective:     BP 109/72   Pulse 72   Temp 97.8 F (36.6 C)   Ht 5' 3.5" (1.613 m)   Wt 125 lb 12.8 oz (57.1 kg)   LMP 07/31/2022 (Approximate)   SpO2 100%   BMI 21.93 kg/m   Wt Readings from Last 3 Encounters:  08/01/22 125 lb 12.8 oz (57.1 kg)  07/25/22 126 lb 6.4 oz (57.3 kg)  07/09/21 130 lb 12.8 oz (59.3 kg) (55 %, Z= 0.12)*   * Growth percentiles are based on CDC (Girls, 2-20 Years) data.    BP Readings from Last 3 Encounters:  08/01/22 109/72  07/25/22 110/76  07/09/21 107/73     Physical Exam Vitals and nursing note reviewed.  Constitutional:      Appearance: Normal appearance.  Cardiovascular:     Rate and Rhythm: Normal rate and regular rhythm.     Pulses: Normal pulses.  Pulmonary:     Effort: Pulmonary effort is normal.     Breath sounds: Normal breath sounds.  Neurological:     Mental Status: She is  alert.  Psychiatric:        Mood and Affect: Mood normal.        Assessment & Plan:  Palpitations  Anxiety   Glad to see her doing much better. She has hydroxyzine 10 mg to keep taking as needed for anxiety. Glad she will be seeing a Veterinary surgeon. Pt knows to reach out if any changes / further concerns.      Return in about 1 year (around 08/01/2023) for physical.    Karlos Scadden M Audrina Marten, PA-C

## 2022-08-13 DIAGNOSIS — L710 Perioral dermatitis: Secondary | ICD-10-CM | POA: Diagnosis not present

## 2022-08-19 DIAGNOSIS — Z9622 Myringotomy tube(s) status: Secondary | ICD-10-CM | POA: Insufficient documentation

## 2022-08-19 DIAGNOSIS — Z8669 Personal history of other diseases of the nervous system and sense organs: Secondary | ICD-10-CM | POA: Diagnosis not present

## 2022-09-10 ENCOUNTER — Other Ambulatory Visit: Payer: Self-pay | Admitting: Physician Assistant

## 2022-09-10 ENCOUNTER — Telehealth: Payer: Self-pay | Admitting: Physician Assistant

## 2022-09-10 DIAGNOSIS — F419 Anxiety disorder, unspecified: Secondary | ICD-10-CM

## 2022-09-10 NOTE — Telephone Encounter (Signed)
Prescription Request  09/10/2022  LOV: 08/01/2022  What is the name of the medication or equipment? hydrOXYzine (ATARAX) 10 MG tablet   Have you contacted your pharmacy to request a refill? Yes   Which pharmacy would you like this sent to?    [id number] [place],  - [address] [place] [address] Phone: [phone] Fax: [phone]   Patient notified that their request is being sent to the clinical staff for review and that they should receive a response within 2 business days.   Please advise at Mobile [phone] (mobile)

## 2022-09-11 MED ORDER — HYDROXYZINE HCL 10 MG PO TABS: 10.0000 mg | ORAL_TABLET | Freq: Three times a day (TID) | ORAL | 2 refills | Status: DC | PRN

## 2022-09-12 NOTE — Telephone Encounter (Signed)
Rx send in on 09/11/2022

## 2022-11-06 DIAGNOSIS — Z1339 Encounter for screening examination for other mental health and behavioral disorders: Secondary | ICD-10-CM | POA: Diagnosis not present

## 2022-11-06 DIAGNOSIS — Z01419 Encounter for gynecological examination (general) (routine) without abnormal findings: Secondary | ICD-10-CM | POA: Diagnosis not present

## 2022-11-06 LAB — HM PAP SMEAR: HM Pap smear: NEGATIVE

## 2022-11-06 LAB — RESULTS CONSOLE HPV: CHL HPV: NEGATIVE

## 2022-11-20 IMAGING — CT CT ABD-PELV W/ CM
2 of 4 series · 16 of 46 positions shown, 18 images · IV contrast (APPLIED)
Comparison: None Available.

CLINICAL DATA: RLQ abdominal pain (Age >= 14y)

EXAM:
CT ABDOMEN AND PELVIS WITH CONTRAST
TECHNIQUE: Multidetector CT imaging of the abdomen and pelvis was performed
using the standard protocol following bolus administration of
intravenous contrast.

[Series 2: abd pel w · axial · 0.64mm/px · z∈[-680,-290]mm · 13 of 86 slices shown, 15 images]
[im 4/86  soft-tissue]
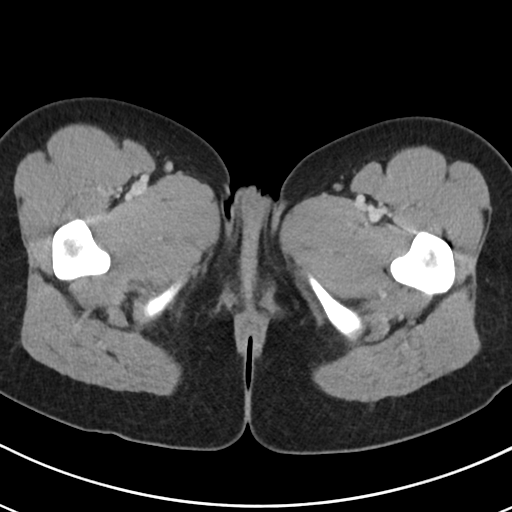
[im 4/86  bone]
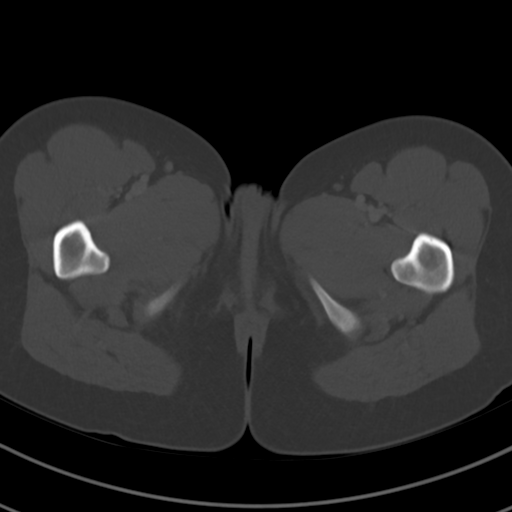
[im 11/86  soft-tissue]
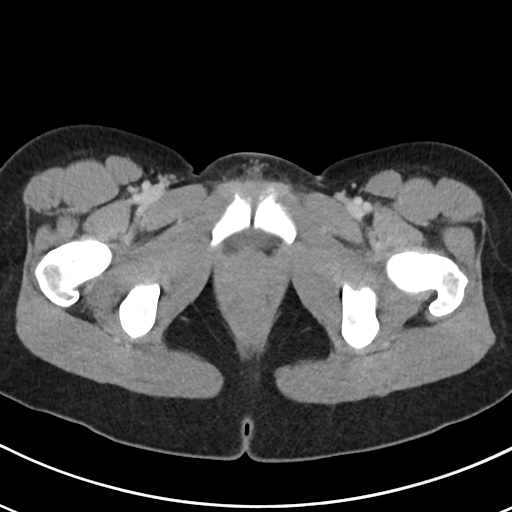
[im 18/86  soft-tissue]
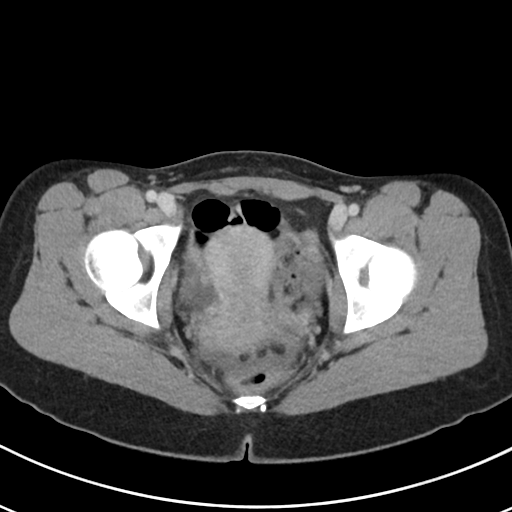
[im 24/86  soft-tissue]
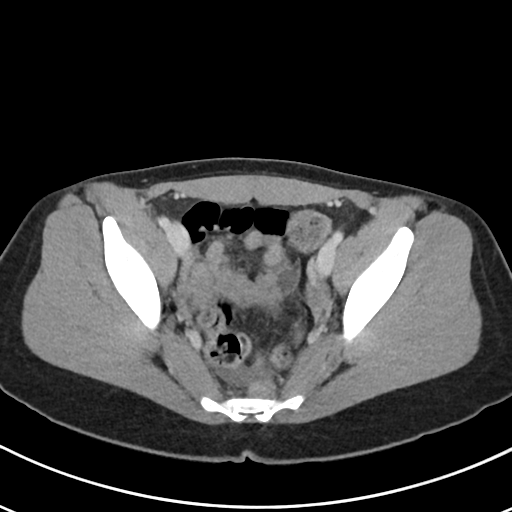
[im 31/86  soft-tissue]
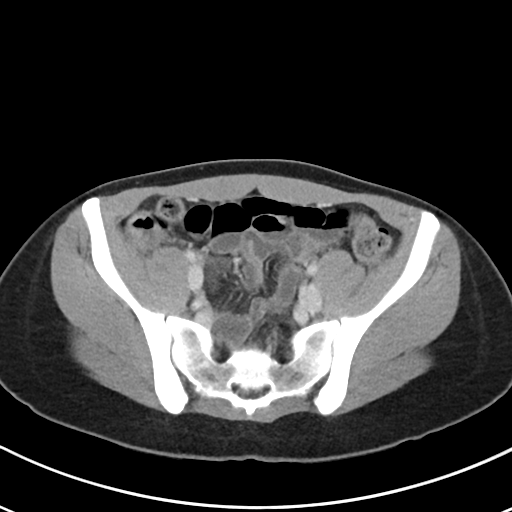
[im 38/86  soft-tissue]
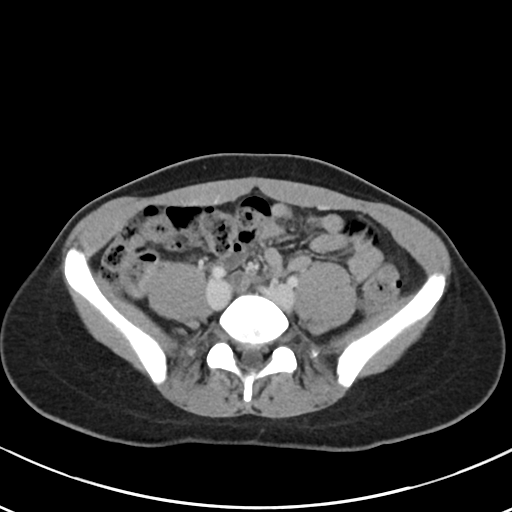
[im 45/86  soft-tissue]
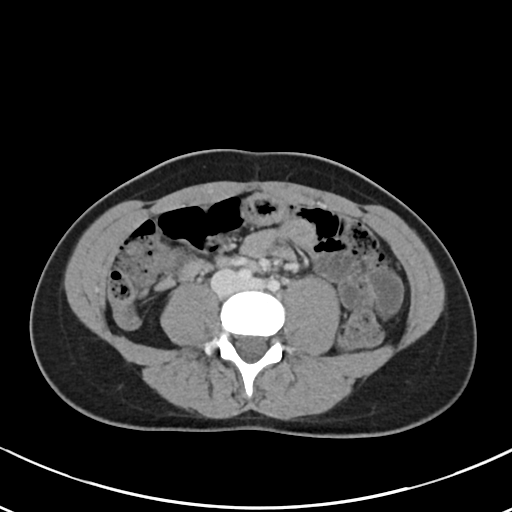
[im 48/86  soft-tissue]
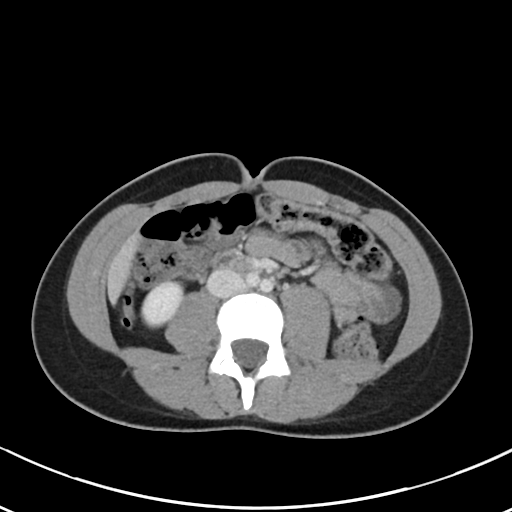
[im 55/86  soft-tissue]
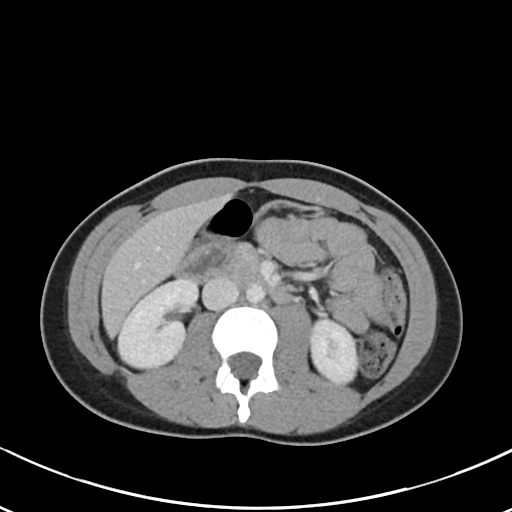
[im 55/86  bone]
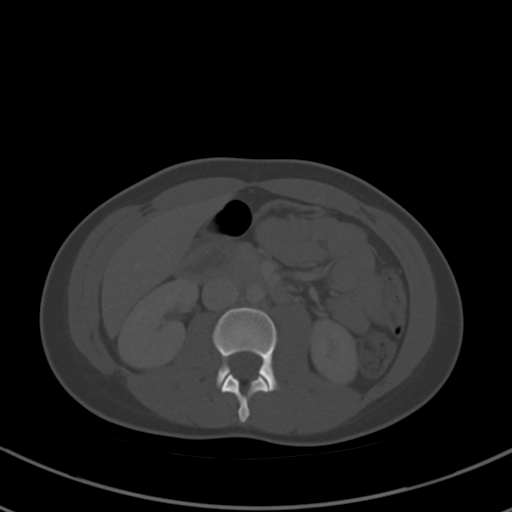
[im 62/86  soft-tissue]
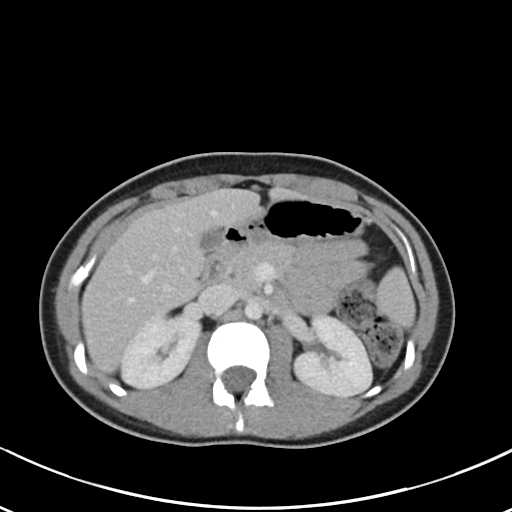
[im 69/86  soft-tissue]
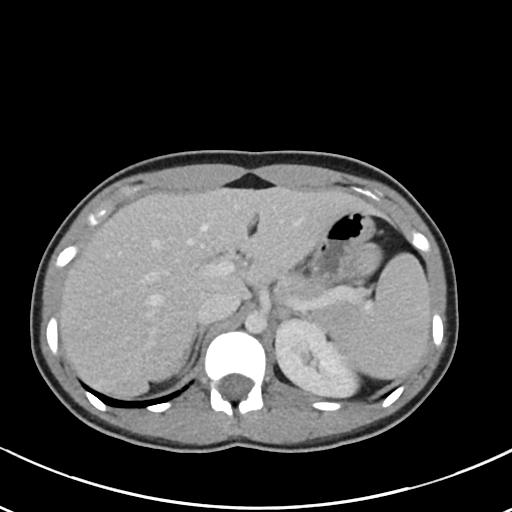
[im 75/86  soft-tissue]
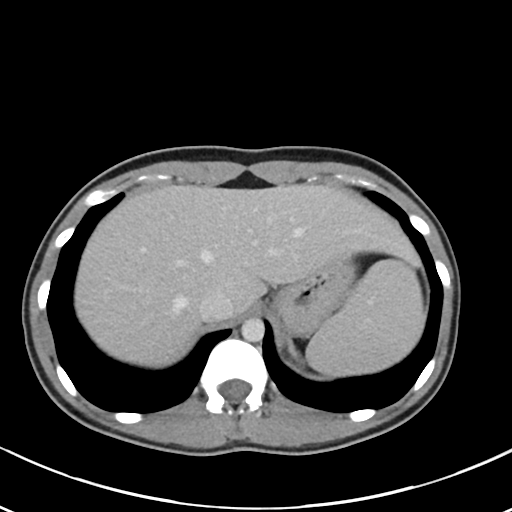
[im 82/86  soft-tissue]
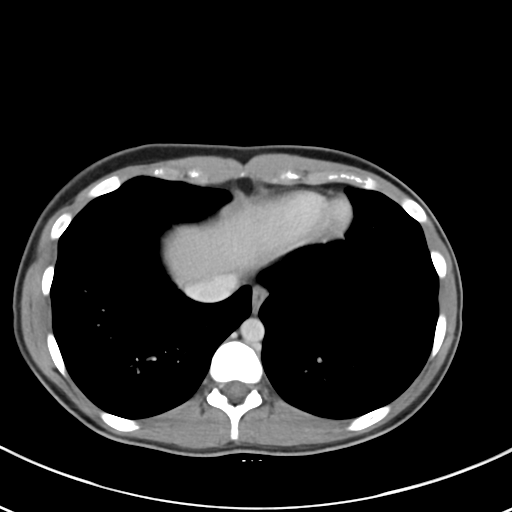

[Series 5: coronal · coronal · 0.67mm/px · 3 of 76 slices shown]
[im 26/76  soft-tissue]
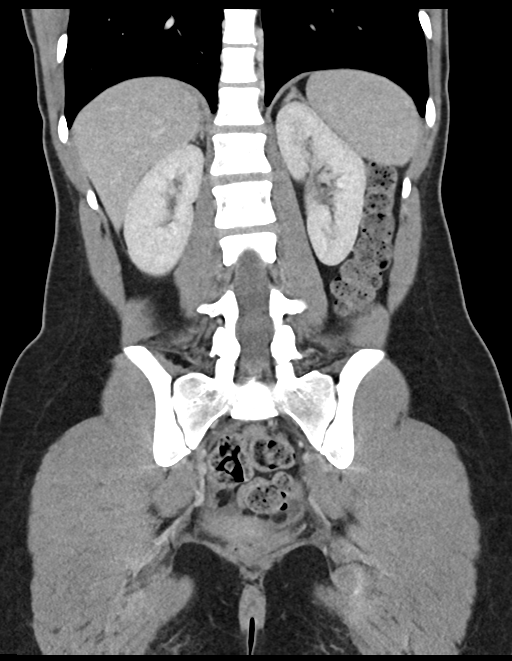
[im 34/76  soft-tissue]
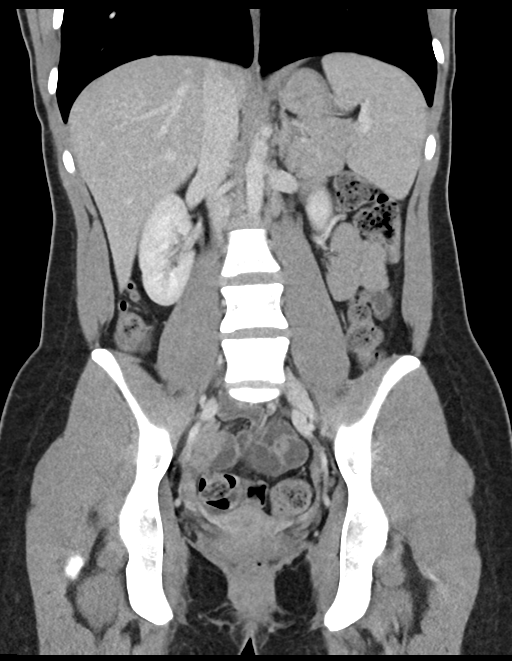
[im 42/76  soft-tissue]
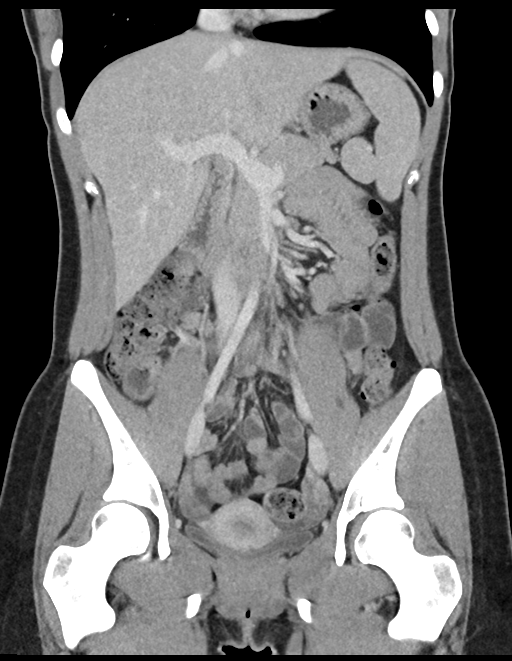

[16 of 46 positions shown; findings below may reference images not displayed]

RADIATION DOSE REDUCTION: This exam was performed according to the
departmental dose-optimization program which includes automated
exposure control, adjustment of the mA and/or kV according to
patient size and/or use of iterative reconstruction technique.

CONTRAST:  75mL OMNIPAQUE IOHEXOL 300 MG/ML  SOLN
FINDINGS: Lower chest: No acute abnormality.

Hepatobiliary: No focal liver abnormality is seen. No gallstones,
gallbladder wall thickening, or biliary dilatation.

Pancreas: Unremarkable. No pancreatic ductal dilatation or
surrounding inflammatory changes.

Spleen: Normal in size without focal abnormality.

Adrenals/Urinary Tract: Adrenals and kidneys are unremarkable.
Bladder is poorly distended but appears unremarkable.

Stomach/Bowel: Stomach is within normal limits. Bowel is normal in
caliber. A normal appendix is identified.

Vascular/Lymphatic: No significant vascular abnormality. Few
prominent and mildly enlarged right mesenteric lymph nodes. Largest
measures 1.6 cm.

Reproductive: Uterus and bilateral adnexa are unremarkable.

Other: Small volume free fluid in the dependent pelvis is likely
physiologic.

Musculoskeletal: Minor degenerative disc disease at L5-S1.
IMPRESSION: Normal appendix. Mild right lower quadrant mesenteric adenopathy is
probably reactive.

## 2023-01-20 ENCOUNTER — Encounter (HOSPITAL_BASED_OUTPATIENT_CLINIC_OR_DEPARTMENT_OTHER): Payer: Self-pay | Admitting: Emergency Medicine

## 2023-01-20 ENCOUNTER — Other Ambulatory Visit: Payer: Self-pay

## 2023-01-20 ENCOUNTER — Emergency Department (HOSPITAL_BASED_OUTPATIENT_CLINIC_OR_DEPARTMENT_OTHER)
Admission: EM | Admit: 2023-01-20 | Discharge: 2023-01-20 | Disposition: A | Discharge status: Home / Self Care | Payer: BC Managed Care – PPO | Attending: Emergency Medicine | Admitting: Emergency Medicine

## 2023-01-20 ENCOUNTER — Telehealth: Payer: Self-pay | Admitting: Physician Assistant

## 2023-01-20 ENCOUNTER — Other Ambulatory Visit (HOSPITAL_BASED_OUTPATIENT_CLINIC_OR_DEPARTMENT_OTHER): Payer: Self-pay

## 2023-01-20 DIAGNOSIS — Z20822 Contact with and (suspected) exposure to covid-19: Secondary | ICD-10-CM | POA: Diagnosis not present

## 2023-01-20 DIAGNOSIS — R109 Unspecified abdominal pain: Secondary | ICD-10-CM | POA: Diagnosis not present

## 2023-01-20 DIAGNOSIS — A059 Bacterial foodborne intoxication, unspecified: Secondary | ICD-10-CM | POA: Diagnosis not present

## 2023-01-20 DIAGNOSIS — R1013 Epigastric pain: Secondary | ICD-10-CM | POA: Diagnosis not present

## 2023-01-20 DIAGNOSIS — R1110 Vomiting, unspecified: Secondary | ICD-10-CM | POA: Diagnosis not present

## 2023-01-20 HISTORY — DX: Unspecified asthma, uncomplicated: J45.909

## 2023-01-20 LAB — CBC
HCT: 40.1 % (ref 36.0–46.0)
Hemoglobin: 13.4 g/dL (ref 12.0–15.0)
MCH: 28.9 pg (ref 26.0–34.0)
MCHC: 33.4 g/dL (ref 30.0–36.0)
MCV: 86.4 fL (ref 80.0–100.0)
Platelets: 187 10*3/uL (ref 150–400)
RBC: 4.64 MIL/uL (ref 3.87–5.11)
RDW: 13.3 % (ref 11.5–15.5)
WBC: 6 10*3/uL (ref 4.0–10.5)
nRBC: 0 % (ref 0.0–0.2)

## 2023-01-20 LAB — URINALYSIS, ROUTINE W REFLEX MICROSCOPIC
Bacteria, UA: NONE SEEN
Bilirubin Urine: NEGATIVE
Glucose, UA: NEGATIVE mg/dL
Ketones, ur: >80 mg/dL — AB
Leukocytes,Ua: NEGATIVE
Nitrite: NEGATIVE
Protein, ur: 30 mg/dL — AB
RBC / HPF: >50 RBC/hpf (ref 0–5)
Specific Gravity, Urine: 1.037 — ABNORMAL HIGH (ref 1.005–1.030)
pH: 6 (ref 5.0–8.0)

## 2023-01-20 LAB — RESP PANEL BY RT-PCR (RSV, FLU A&B, COVID)  RVPGX2
Influenza A by PCR: NEGATIVE
Influenza B by PCR: NEGATIVE
Resp Syncytial Virus by PCR: NEGATIVE
SARS Coronavirus 2 by RT PCR: NEGATIVE

## 2023-01-20 LAB — COMPREHENSIVE METABOLIC PANEL
ALT: 14 U/L (ref 0–44)
AST: 20 U/L (ref 15–41)
Albumin: 4.6 g/dL (ref 3.5–5.0)
Alkaline Phosphatase: 39 U/L (ref 38–126)
Anion gap: 10 (ref 5–15)
BUN: 12 mg/dL (ref 6–20)
CO2: 24 mmol/L (ref 22–32)
Calcium: 8.9 mg/dL (ref 8.9–10.3)
Chloride: 105 mmol/L (ref 98–111)
Creatinine, Ser: 0.76 mg/dL (ref 0.44–1.00)
GFR, Estimated: >60 mL/min (ref >60)
Glucose, Bld: 123 mg/dL — ABNORMAL HIGH (ref 70–99)
Potassium: 3.7 mmol/L (ref 3.5–5.1)
Sodium: 139 mmol/L (ref 135–145)
Total Bilirubin: 1 mg/dL (ref <1.2)
Total Protein: 7.2 g/dL (ref 6.5–8.1)

## 2023-01-20 LAB — LIPASE, BLOOD: Lipase: <10 U/L — ABNORMAL LOW (ref 11–51)

## 2023-01-20 LAB — PREGNANCY, URINE: Preg Test, Ur: NEGATIVE

## 2023-01-20 MED ORDER — ONDANSETRON HCL 4 MG/2ML IJ SOLN
4.0000 mg | Freq: Once | INTRAMUSCULAR | Status: AC
  Administered 2023-01-20: 4 mg via INTRAVENOUS
  Filled 2023-01-20: qty 2

## 2023-01-20 MED ORDER — ONDANSETRON HCL 4 MG PO TABS
4.0000 mg | ORAL_TABLET | Freq: Four times a day (QID) | ORAL | 0 refills | Status: DC
  Filled 2023-01-20: qty 12, 3d supply, fill #0

## 2023-01-20 NOTE — Telephone Encounter (Signed)
Please see triage note; pt on the way to ER

## 2023-01-20 NOTE — ED Triage Notes (Signed)
Pt arrived POV, caox4, ambulatory c/o N/V/D since yesterday afternoon. Pt reports she has been unable to hold down PO food or drink since yesterday afternoon. Pt reports temp of 101.5 F this morning at home, has not taken meds, afebrile at present. Pt states she is concerned she has food poisoning because s/s started all at the same time approx 2 hrs after eating [employer].

## 2023-01-20 NOTE — Telephone Encounter (Signed)
Noted and agreed, thank you. 

## 2023-01-20 NOTE — Telephone Encounter (Signed)
Clinical call: Patient going to ED. Declined Triage   Patient Name First: Carol Last: Brown Gender: Female DOB: 01-26-2001 Age: 22 Y 4 M 29 D Return Phone Number: [phone] (Primary), [phone] (Secondary) Address: City/ State/ Zip: [place]  [address] Client [facility] - Human resources officer [facility] [employer] Type Call Who Is Calling Patient / Member / Family / Caregiver Call Type Triage / Clinical Caller Name [place] Relationship To Patient Mother Return Phone Number [phone] (Primary) Chief Complaint Vomiting Reason for Call Symptomatic / Request for Health Information Initial Comment Caller states her dtr is vomiting. She has diarrhea too. Translation No No Triage Reason Patient declined Nurse Assessment Nurse: Alycia Rossetti, RN, Judeth Cornfield Date/Time Lamount Cohen Time): 01/20/2023 6:53:35 AM Confirm and document reason for call. If symptomatic, describe symptoms. ---Caller stated that she has vomiting and diarrhea starting around 2:45 PM yesterday. States fever is 101.67F tympanic. Caller states that she also has lower and mid back pain starting last night. States it is getting worse. Caller states that she is on the way to the ER now. Caller declines triage d/t being almost to ER. Does the patient have any new or worsening symptoms? ---Yes Will a triage be completed? ---No Select reason for no triage. ---Patient declined Disp. Time Lamount Cohen Time) Disposition Final User 01/20/2023 6:58:28 AM Clinical Call Yes Alycia Rossetti, RN, Judeth Cornfield Final Disposition 01/20/2023 6:58:28 AM Clinical Call Yes Alycia Rossetti, RN, Judeth Cornfield Referrals [facility] - ED

## 2023-01-20 NOTE — ED Notes (Signed)
Pt given discharge instructions and reviewed prescriptions. Opportunities given for questions. Pt verbalizes understanding. PIV removed x1. Stone,Heather R, RN 

## 2023-01-20 NOTE — Discharge Instructions (Addendum)
You were seen today for nausea, vomiting, diarrhea.  This is most likely due to a febrile illness.  Unlikely to be kidney stone related or needing antibiotics.  This should resolve with over the course of the next few days.  However if symptoms persist follow-up with PCP  I have also prescribed Zofran for nausea.  You can take this every 4-6 hours as needed.  For pain and achiness you can use Tylenol and ibuprofen.  Take Tylenol (acetominophen)  650mg  every 4-6 hours, as needed for pain or fever. Do not take more than 4,000 mg in a 24-hour period. As this may cause liver damage. While this is rare, if you begin to develop yellowing of the skin or eyes, stop taking and return to ER immediately.  Take Ibuprofen 400mg  every 4-6 hours for pain or fever, not exceeding 3,200 mg per day as more than 3,200mg  can cause Stomach irritation, dizziness, kidney issues with long-term use.  If you continue to have new or worsening symptoms, shortness of breath, unable to keep fluids down, dizziness, fatigue, chest pain return to the ER for further evaluation.  It was a pleasure seeing you in the ER.

## 2023-01-20 NOTE — ED Provider Notes (Signed)
[place] EMERGENCY DEPARTMENT AT [facility] Provider Note   CSN: [id number] Arrival date & time: 01/20/23  4098     History  Chief Complaint  Patient presents with   Diarrhea    Carol Brown is a 22 y.o. female.   Diarrhea Associated symptoms: abdominal pain and vomiting   Patient presents to the ED complaining of fourteen hour history of vomiting, diarrhea, abdominal pain after eating [employer].  She has had multiple episodes of diarrhea, vomiting since.  However upon arrival she has started to feel better.  Endorses fever at home with a Tmax of 101.0, headache.  She has been able to keep down a little water since this morning.  Denies shortness of breath, chest pain, cough, rhinorrhea, sore throat, dysuria, hematuria, hematochezia, melena.     Home Medications Prior to Admission medications   Medication Sig Start Date End Date Taking? Authorizing Provider  ondansetron (ZOFRAN) 4 MG tablet Take 1 tablet (4 mg total) by mouth every 6 (six) hours. 01/20/23  Yes Lunette Stands, PA-C  hydrOXYzine (ATARAX) 10 MG tablet Take 1 tablet (10 mg total) by mouth 3 (three) times daily as needed for anxiety. 09/11/22   Allwardt, Crist Infante, PA-C  ofloxacin (FLOXIN) 0.3 % OTIC solution Place 10 drops into both ears daily. 07/13/22   [provider]      Allergies    Patient has no known allergies.    Review of Systems   Review of Systems  Gastrointestinal:  Positive for abdominal pain, diarrhea, nausea and vomiting.    Physical Exam Updated Vital Signs BP 108/68   Pulse 100   Temp 98.3 F (36.8 C)   Resp 20   Ht 5\' 3"  (1.6 m)   Wt 54 kg   LMP 01/20/2023 (Approximate)   SpO2 100%   BMI 21.08 kg/m  Physical Exam Vitals and nursing note reviewed.  Constitutional:      Appearance: Normal appearance.  HENT:     Head: Normocephalic and atraumatic.     Mouth/Throat:     Mouth: Mucous membranes are moist.     Pharynx: Oropharynx is clear. No oropharyngeal  exudate.  Eyes:     General: No scleral icterus.       Right eye: No discharge.        Left eye: No discharge.     Extraocular Movements: Extraocular movements intact.     Conjunctiva/sclera: Conjunctivae normal.  Cardiovascular:     Rate and Rhythm: Normal rate and regular rhythm.     Pulses: Normal pulses.     Heart sounds: Normal heart sounds. No murmur heard.    No friction rub. No gallop.  Pulmonary:     Effort: Pulmonary effort is normal. No respiratory distress.     Breath sounds: Normal breath sounds. No stridor. No wheezing or rales.  Abdominal:     General: Abdomen is flat. There is no distension.     Palpations: Abdomen is soft. There is no mass.     Tenderness: There is abdominal tenderness (Mild epigastric tenderness on palpation). There is no right CVA tenderness, left CVA tenderness or guarding.     Hernia: No hernia is present.  Musculoskeletal:        General: No swelling.  Lymphadenopathy:     Cervical: No cervical adenopathy.  Skin:    General: Skin is warm and dry.     Coloration: Skin is not jaundiced or pale.     Findings: No erythema or lesion.  Neurological:     General: No focal deficit present.     Mental Status: She is alert. Mental status is at baseline.  Psychiatric:        Mood and Affect: Mood normal.     ED Results / Procedures / Treatments   Labs (all labs ordered are listed, but only abnormal results are displayed) Labs Reviewed  LIPASE, BLOOD - Abnormal; Notable for the following components:      Result Value   Lipase <10 (*)    All other components within normal limits  COMPREHENSIVE METABOLIC PANEL - Abnormal; Notable for the following components:   Glucose, Bld 123 (*)    All other components within normal limits  URINALYSIS, ROUTINE W REFLEX MICROSCOPIC - Abnormal; Notable for the following components:   Specific Gravity, Urine 1.037 (*)    Hgb urine dipstick LARGE (*)    Ketones, ur >80 (*)    Protein, ur 30 (*)    All other  components within normal limits  RESP PANEL BY RT-PCR (RSV, FLU A&B, COVID)  RVPGX2  CBC  PREGNANCY, URINE    EKG None  Radiology No results found.  Procedures Procedures    Medications Ordered in ED Medications  ondansetron (ZOFRAN) injection 4 mg (4 mg Intravenous Given 01/20/23 0806)    ED Course/ Medical Decision Making/ A&P                                Medical Decision Making Amount and/or Complexity of Data Reviewed Labs: ordered.  Risk Prescription drug management.   This patient is a 22 year old female who presents to the ED for concern of nausea, vomiting, diarrhea, epigastric abdominal pain.   Differential diagnoses prior to evaluation: The emergent differential diagnosis includes, but is not limited to, foodborne illness, gastritis, appendicitis, cholecystitis, IBS, pancreatitis, bowel obstruction, nephrolithiasis. This is not an exhaustive differential.   Past Medical History / Co-morbidities / Social History: Asthma  Lab Tests/Imaging studies: I personally interpreted labs/imaging and the pertinent results include:   Respiratory panel negative Lipase less than 10 UA shows hemoglobin, slightly elevated specific gravity, ketones and protein.  Most likely due to foodborne illness. CMP is within normal limits with normal kidney function, liver function, no lab metabolite abnormality  Pregnancy negative CBC shows no sign of anemia or signs of infection.    Medications: I ordered medication including 4 mg IV Zofran for nausea.  I have reviewed the patients home medicines and have made adjustments as needed.  ED Course:  Patient is 74 female who presents 15 hours after eating [employer] where subsequently having nausea, vomiting, diarrhea since.  She currently is nauseous in the ER.  Zofran was provided.  She has been able to tolerate minimal amount of fluids without vomiting.  Also complaining of back pain with no CVA tenderness upon exam.  Mild epigastric  abdominal pain noted on exam.  This is most likely foodborne illness related as of happening within 2 hours after eating.  Patient was able to tolerate a fluid challenge.  Hemoglobin, ketones, protein, mildly elevated specific gravity noted on UA.  This is most likely due to the foodborne illness.  Low likelihood of nephrolithiasis.  Provided patient education on foodborne illnesses as well as providing strict return to ER precautions.  Provided Zofran and OTC pain medication for home.  Patient appears to be discharged at this time.  Patient expressed understanding and agreement with plan.  Disposition: After consideration of the diagnostic results and the patients response to treatment, I feel that patient benefit from discharge and treatment noted as above.   emergency department workup does not suggest an emergent condition requiring admission or immediate intervention beyond what has been performed at this time. The plan is: Zofran for nausea, symptomatic treatment for pain, diarrhea, and return to ER for new or worsening symptoms.. The patient is safe for discharge and has been instructed to return immediately for worsening symptoms, change in symptoms or any other concerns.   Final Clinical Impression(s) / ED Diagnoses Final diagnoses:  Foodborne gastroenteritis    Rx / DC Orders ED Discharge Orders          Ordered    ondansetron (ZOFRAN) 4 MG tablet  Every 6 hours        01/20/23 0904              Lunette Stands, PA-C 01/20/23 2956    Derwood Kaplan, MD 01/21/23 1001

## 2023-01-23 ENCOUNTER — Ambulatory Visit: Payer: BC Managed Care – PPO | Admitting: Family

## 2023-01-27 ENCOUNTER — Encounter: Payer: Self-pay | Admitting: Physician Assistant

## 2023-01-27 ENCOUNTER — Ambulatory Visit (INDEPENDENT_AMBULATORY_CARE_PROVIDER_SITE_OTHER): Payer: BC Managed Care – PPO | Admitting: Physician Assistant

## 2023-01-27 VITALS — BP 106/68 | HR 67 | Temp 97.7°F | Ht 63.0 in | Wt 117.6 lb

## 2023-01-27 DIAGNOSIS — Z0000 Encounter for general adult medical examination without abnormal findings: Secondary | ICD-10-CM

## 2023-01-27 DIAGNOSIS — F419 Anxiety disorder, unspecified: Secondary | ICD-10-CM | POA: Diagnosis not present

## 2023-01-27 DIAGNOSIS — Z8349 Family history of other endocrine, nutritional and metabolic diseases: Secondary | ICD-10-CM | POA: Diagnosis not present

## 2023-01-27 DIAGNOSIS — R634 Abnormal weight loss: Secondary | ICD-10-CM

## 2023-01-27 DIAGNOSIS — B3731 Acute candidiasis of vulva and vagina: Secondary | ICD-10-CM | POA: Diagnosis not present

## 2023-01-27 LAB — COMPREHENSIVE METABOLIC PANEL
ALT: 26 U/L (ref 0–35)
AST: 26 U/L (ref 0–37)
Albumin: 4.4 g/dL (ref 3.5–5.2)
Alkaline Phosphatase: 36 U/L — ABNORMAL LOW (ref 39–117)
BUN: 7 mg/dL (ref 6–23)
CO2: 30 meq/L (ref 19–32)
Calcium: 8.8 mg/dL (ref 8.4–10.5)
Chloride: 103 meq/L (ref 96–112)
Creatinine, Ser: 0.74 mg/dL (ref 0.40–1.20)
GFR: 115.64 mL/min (ref >60.00)
Glucose, Bld: 69 mg/dL — ABNORMAL LOW (ref 70–99)
Potassium: 3.9 meq/L (ref 3.5–5.1)
Sodium: 139 meq/L (ref 135–145)
Total Bilirubin: 0.4 mg/dL (ref 0.2–1.2)
Total Protein: 6.8 g/dL (ref 6.0–8.3)

## 2023-01-27 LAB — HEMOGLOBIN A1C: Hgb A1c MFr Bld: 5.2 % (ref 4.6–6.5)

## 2023-01-27 NOTE — Patient Instructions (Signed)
Healthy practices for vaginal hygiene - Avoid pajamas. A robe / nightgown allows better air circulation. Sleep without panties / panties whenever possible. - Wear cotton panties / panties during the day. After washing the panties / panties, rinse them twice to avoid irritating residues. Do not use fabric softeners for panties and / or swimsuits. - Avoid tights, leotard, leggings, tight pants or other tight attire. Loose skirts and pants allow air to circulate. - Avoid the pantiprotector. Better use tampons or sanitary pads. It is beneficial to bathe with warm water every day: - Soak in clean water (without soap) for 10 to 15 minutes. It has not been specifically studied that adding vinegar or baking soda / baking soda to water is of benefit and may not be better than water alone. - Use soap to wash the other regions apart from the genital area before leaving the tub. Limit the use of soap in the genital areas. Use soaps without fragrance. - Rinse the genital area and pat dry. A hair dryer can only be used if cold air is used. - Do not use bubble bath or fragrant soap. - Do not use vaginal sprays or talcum powder. These contain chemicals that irritate the skin. - If the genital area is sore or swollen, fragrance-free disposable wipes can be used instead of toilet paper. - Emollients, such as Vaseline may help protect the skin and may be applied to the irritated area. - Always remember to clean yourself from front to back after bowel movements. Pat dry after urinating. - Don't keep the wet swimsuit on for a long time after swimming.

## 2023-01-27 NOTE — Progress Notes (Signed)
Patient ID: Carol Brown, female    DOB: [birth year], 22 y.o.   MRN: [id number]   Assessment & Plan:  Annual physical exam -     Comprehensive metabolic panel -     Thyroid Panel With TSH -     Hemoglobin A1c  Family history of Graves' disease -     Comprehensive metabolic panel -     Thyroid Panel With TSH  Vaginal yeast infection -     Hemoglobin A1c  Anxiety  Weight loss   Age-appropriate screening and counseling performed today. Will check labs and call with results. Preventive measures discussed and printed in AVS for patient.   Patient Counseling: [x]   Nutrition: Stressed importance of moderation in sodium/caffeine intake, saturated fat and cholesterol, caloric balance, sufficient intake of fresh fruits, vegetables, and fiber.  [x]   Stressed the importance of regular exercise.   []   Substance Abuse: Discussed cessation/primary prevention of tobacco, alcohol, or other drug use; driving or other dangerous activities under the influence; availability of treatment for abuse.   []   Injury prevention: Discussed safety belts, safety helmets, smoke detector, smoking near bedding or upholstery.   [x]   Sexuality: Discussed sexually transmitted diseases, partner selection, use of condoms, avoidance of unintended pregnancy  and contraceptive alternatives.   [x]   Dental health: Discussed importance of regular tooth brushing, flossing, and dental visits.  [x]   Health maintenance and immunizations reviewed. Please refer to Health maintenance section.     Pt to continue working with counseling. She will reach out if needing any further assistance for anxiety - seems to be contributing to her weight loss. She will monitor closely. Regular meals, yogurt in diet daily advised.     Return in about 1 year (around 01/27/2024) for physical.    Subjective:    Chief Complaint  Patient presents with   Annual Exam    Pt in office for annual CPE and labs; [facility] and patient seding MyChart message w/ results; pt got a bad yeast infection in May but getting them all the time now and seems like since having that first one it keeps happening more frequent.     HPI Patient is in today for annual exam. Pap done with wellness at school, will send results. Feeling ok today.   Still working through anxiety - new diagnosis of OCD, working with a Veterinary surgeon. Takes hydroxyzine as needed sometimes, this does help with anxiousness and loss of appetite at times.   Walks regularly - 20-40 min daily on way to classes.  Not sexually active, doesn't plan to be. No need for birth control a this time per pt.   Since [place] this year, seems to be getting recurrent itching / ?vaginal yeast infections.    Past Medical History:  Diagnosis Date   Asthma     History reviewed. No pertinent surgical history.  History reviewed. No pertinent family history.  Social History   Tobacco Use   Smoking status: Never   Smokeless tobacco: Never  Substance Use Topics   Alcohol use: Never   Drug use: Never     Allergies  Allergen Reactions   Dextromethorphan Other (See Comments)    Review of Systems NEGATIVE UNLESS OTHERWISE INDICATED IN HPI      Objective:     BP 106/68 (BP Location: Left Arm, Patient Position: Sitting, Cuff Size: Normal)   Pulse 67   Temp 97.7 F (36.5 C) (Temporal)   Ht  5\' 3"  (1.6 m)   Wt 117 lb 9.6 oz (53.3 kg)   LMP 01/20/2023 (Approximate)   SpO2 98%   BMI 20.83 kg/m   Wt Readings from Last 3 Encounters:  01/27/23 117 lb 9.6 oz (53.3 kg)  01/20/23 119 lb (54 kg)  08/01/22 125 lb 12.8 oz (57.1 kg)    BP Readings from Last 3 Encounters:  01/27/23 106/68  01/20/23 108/68  08/01/22 109/72     Physical Exam Vitals and nursing note reviewed.  Constitutional:      Appearance: Normal appearance. She is normal weight. She is not toxic-appearing.  HENT:     Head: Normocephalic and atraumatic.     Right Ear:  Tympanic membrane, ear canal and external ear normal.     Left Ear: Tympanic membrane, ear canal and external ear normal.     Nose: Nose normal.     Mouth/Throat:     Mouth: Mucous membranes are moist.  Eyes:     Extraocular Movements: Extraocular movements intact.     Conjunctiva/sclera: Conjunctivae normal.     Pupils: Pupils are equal, round, and reactive to light.  Cardiovascular:     Rate and Rhythm: Normal rate and regular rhythm.     Pulses: Normal pulses.     Heart sounds: Normal heart sounds.  Pulmonary:     Effort: Pulmonary effort is normal.     Breath sounds: Normal breath sounds.  Abdominal:     General: Abdomen is flat. Bowel sounds are normal.     Palpations: Abdomen is soft.  Musculoskeletal:        General: Normal range of motion.     Cervical back: Normal range of motion and neck supple.  Skin:    General: Skin is warm and dry.  Neurological:     General: No focal deficit present.     Mental Status: She is alert and oriented to person, place, and time.  Psychiatric:        Mood and Affect: Mood normal.        Behavior: Behavior normal.        Thought Content: Thought content normal.        Judgment: Judgment normal.           Raylin Winer M Rickita Forstner, PA-C

## 2023-01-28 LAB — THYROID PANEL WITH TSH
Free Thyroxine Index: 2.5 (ref 1.4–3.8)
T3 Uptake: 35 % (ref 22–35)
T4, Total: 7 ug/dL (ref 5.1–11.9)
TSH: 0.69 m[IU]/L

## 2023-02-17 DIAGNOSIS — J101 Influenza due to other identified influenza virus with other respiratory manifestations: Secondary | ICD-10-CM | POA: Diagnosis not present

## 2023-02-17 DIAGNOSIS — R509 Fever, unspecified: Secondary | ICD-10-CM | POA: Diagnosis not present

## 2023-03-31 DIAGNOSIS — Z1339 Encounter for screening examination for other mental health and behavioral disorders: Secondary | ICD-10-CM | POA: Diagnosis not present

## 2023-03-31 DIAGNOSIS — R195 Other fecal abnormalities: Secondary | ICD-10-CM | POA: Diagnosis not present

## 2023-03-31 DIAGNOSIS — Z711 Person with feared health complaint in whom no diagnosis is made: Secondary | ICD-10-CM | POA: Diagnosis not present

## 2023-03-31 DIAGNOSIS — L658 Other specified nonscarring hair loss: Secondary | ICD-10-CM | POA: Diagnosis not present

## 2023-03-31 DIAGNOSIS — I888 Other nonspecific lymphadenitis: Secondary | ICD-10-CM | POA: Diagnosis not present

## 2023-04-02 ENCOUNTER — Encounter: Payer: Self-pay | Admitting: Physician Assistant

## 2023-04-02 NOTE — Telephone Encounter (Signed)
 Please see pt msg in regards to recent lab results and advise

## 2023-04-03 ENCOUNTER — Telehealth: Payer: Self-pay

## 2023-04-03 ENCOUNTER — Other Ambulatory Visit: Payer: Self-pay

## 2023-04-03 DIAGNOSIS — R790 Abnormal level of blood mineral: Secondary | ICD-10-CM

## 2023-04-03 NOTE — Telephone Encounter (Signed)
 Noted, see MyChart.

## 2023-04-03 NOTE — Telephone Encounter (Signed)
 Copied from CRM [id number]. Topic: Clinical - Medical Advice >> Apr 02, 2023  3:00 PM Fredrich Romans wrote: Reason for CRM: Patients mom would like to know which direction to go in for her daughter to receive an iron infusion?She has an appointment with Alyssa on 04/14/2023,however mom just wants her to be referred out somewhere to get an infusion asasp instead of seeing alyssa if we do not offer infusions in the office. Daughter wont be home from school until March 7,2025,she she would like her to get set up for that date if theres a way.  Please see patient Mom call note and request and advise.

## 2023-04-03 NOTE — Telephone Encounter (Signed)
 Copied from CRM [id number]. Topic: General - Other >> Apr 02, 2023 10:54 AM Truddie Crumble wrote: Reason for CRM: patient mom called wanting to see if the patient can do a iron infusion while she is home on 3/7 so she can get start feeling better. Patient mom stated the patient  hair is falling out and she has numbness in her fingertips and feet. The patient feet are cold and her fingertips are white. Patient has joint pain and sharp pains in her chest. Patient ferritin level is a four, she has lost weight and can't concentrate. Patient is still waiting on the blood results for her auto immune from the health center in [place] where she goes to school  CB [phone] (polly)  Please see pt Mom message regarding iron infusion and advise;

## 2023-04-03 NOTE — Telephone Encounter (Signed)
 Patient Mom called twice 04/02/23 requesting iron infusion instead of coming into the office on 04/14/23. CRM messages forwarded for review

## 2023-04-04 DIAGNOSIS — K921 Melena: Secondary | ICD-10-CM | POA: Diagnosis not present

## 2023-04-04 DIAGNOSIS — R634 Abnormal weight loss: Secondary | ICD-10-CM | POA: Diagnosis not present

## 2023-04-04 DIAGNOSIS — R790 Abnormal level of blood mineral: Secondary | ICD-10-CM | POA: Diagnosis not present

## 2023-04-04 DIAGNOSIS — L659 Nonscarring hair loss, unspecified: Secondary | ICD-10-CM | POA: Diagnosis not present

## 2023-04-11 DIAGNOSIS — L650 Telogen effluvium: Secondary | ICD-10-CM | POA: Diagnosis not present

## 2023-04-14 ENCOUNTER — Ambulatory Visit: Payer: BC Managed Care – PPO | Admitting: Physician Assistant

## 2023-04-15 DIAGNOSIS — R630 Anorexia: Secondary | ICD-10-CM | POA: Diagnosis not present

## 2023-04-15 DIAGNOSIS — L659 Nonscarring hair loss, unspecified: Secondary | ICD-10-CM | POA: Diagnosis not present

## 2023-04-15 DIAGNOSIS — K921 Melena: Secondary | ICD-10-CM | POA: Diagnosis not present

## 2023-04-15 DIAGNOSIS — K9041 Non-celiac gluten sensitivity: Secondary | ICD-10-CM | POA: Diagnosis not present

## 2023-04-22 DIAGNOSIS — K3189 Other diseases of stomach and duodenum: Secondary | ICD-10-CM | POA: Diagnosis not present

## 2023-04-22 DIAGNOSIS — K648 Other hemorrhoids: Secondary | ICD-10-CM | POA: Diagnosis not present

## 2023-04-22 DIAGNOSIS — K2289 Other specified disease of esophagus: Secondary | ICD-10-CM | POA: Diagnosis not present

## 2023-04-22 DIAGNOSIS — K6389 Other specified diseases of intestine: Secondary | ICD-10-CM | POA: Diagnosis not present

## 2023-04-22 DIAGNOSIS — K625 Hemorrhage of anus and rectum: Secondary | ICD-10-CM | POA: Diagnosis not present

## 2023-04-22 DIAGNOSIS — R634 Abnormal weight loss: Secondary | ICD-10-CM | POA: Diagnosis not present

## 2023-04-22 DIAGNOSIS — K2970 Gastritis, unspecified, without bleeding: Secondary | ICD-10-CM | POA: Diagnosis not present

## 2023-04-22 DIAGNOSIS — K5909 Other constipation: Secondary | ICD-10-CM | POA: Diagnosis not present

## 2023-04-29 DIAGNOSIS — D509 Iron deficiency anemia, unspecified: Secondary | ICD-10-CM | POA: Insufficient documentation

## 2023-05-02 DIAGNOSIS — R634 Abnormal weight loss: Secondary | ICD-10-CM | POA: Diagnosis not present

## 2023-05-02 DIAGNOSIS — Z7689 Persons encountering health services in other specified circumstances: Secondary | ICD-10-CM | POA: Diagnosis not present

## 2023-05-02 DIAGNOSIS — L049 Acute lymphadenitis, unspecified: Secondary | ICD-10-CM | POA: Diagnosis not present

## 2023-05-02 DIAGNOSIS — R221 Localized swelling, mass and lump, neck: Secondary | ICD-10-CM | POA: Diagnosis not present

## 2023-05-05 DIAGNOSIS — R634 Abnormal weight loss: Secondary | ICD-10-CM | POA: Diagnosis not present

## 2023-05-05 DIAGNOSIS — L049 Acute lymphadenitis, unspecified: Secondary | ICD-10-CM | POA: Diagnosis not present

## 2023-05-05 DIAGNOSIS — R221 Localized swelling, mass and lump, neck: Secondary | ICD-10-CM | POA: Diagnosis not present

## 2023-05-20 DIAGNOSIS — D509 Iron deficiency anemia, unspecified: Secondary | ICD-10-CM | POA: Diagnosis not present

## 2023-05-23 DIAGNOSIS — K9041 Non-celiac gluten sensitivity: Secondary | ICD-10-CM | POA: Diagnosis not present

## 2023-05-23 DIAGNOSIS — R630 Anorexia: Secondary | ICD-10-CM | POA: Diagnosis not present

## 2023-05-23 DIAGNOSIS — K921 Melena: Secondary | ICD-10-CM | POA: Diagnosis not present

## 2023-05-23 DIAGNOSIS — R1013 Epigastric pain: Secondary | ICD-10-CM | POA: Diagnosis not present

## 2023-05-27 DIAGNOSIS — R634 Abnormal weight loss: Secondary | ICD-10-CM | POA: Diagnosis not present

## 2023-05-27 DIAGNOSIS — R221 Localized swelling, mass and lump, neck: Secondary | ICD-10-CM | POA: Diagnosis not present

## 2023-05-27 DIAGNOSIS — D509 Iron deficiency anemia, unspecified: Secondary | ICD-10-CM | POA: Diagnosis not present

## 2023-06-12 DIAGNOSIS — Z01419 Encounter for gynecological examination (general) (routine) without abnormal findings: Secondary | ICD-10-CM | POA: Diagnosis not present

## 2023-06-12 DIAGNOSIS — Z113 Encounter for screening for infections with a predominantly sexual mode of transmission: Secondary | ICD-10-CM | POA: Diagnosis not present

## 2023-06-12 DIAGNOSIS — Z6821 Body mass index (BMI) 21.0-21.9, adult: Secondary | ICD-10-CM | POA: Diagnosis not present

## 2023-06-12 DIAGNOSIS — Z124 Encounter for screening for malignant neoplasm of cervix: Secondary | ICD-10-CM | POA: Diagnosis not present

## 2023-06-12 DIAGNOSIS — D649 Anemia, unspecified: Secondary | ICD-10-CM | POA: Insufficient documentation

## 2023-06-24 DIAGNOSIS — H6691 Otitis media, unspecified, right ear: Secondary | ICD-10-CM | POA: Diagnosis not present

## 2023-06-24 DIAGNOSIS — H6992 Unspecified Eustachian tube disorder, left ear: Secondary | ICD-10-CM | POA: Diagnosis not present

## 2023-07-01 DIAGNOSIS — Z77120 Contact with and (suspected) exposure to mold (toxic): Secondary | ICD-10-CM | POA: Diagnosis not present

## 2023-07-01 DIAGNOSIS — R599 Enlarged lymph nodes, unspecified: Secondary | ICD-10-CM | POA: Diagnosis not present

## 2023-07-01 DIAGNOSIS — E611 Iron deficiency: Secondary | ICD-10-CM | POA: Diagnosis not present

## 2023-07-01 DIAGNOSIS — K581 Irritable bowel syndrome with constipation: Secondary | ICD-10-CM | POA: Diagnosis not present

## 2023-07-01 DIAGNOSIS — N920 Excessive and frequent menstruation with regular cycle: Secondary | ICD-10-CM | POA: Diagnosis not present

## 2023-07-01 DIAGNOSIS — Z1321 Encounter for screening for nutritional disorder: Secondary | ICD-10-CM | POA: Diagnosis not present

## 2023-07-01 DIAGNOSIS — E559 Vitamin D deficiency, unspecified: Secondary | ICD-10-CM | POA: Diagnosis not present

## 2023-07-01 DIAGNOSIS — R5383 Other fatigue: Secondary | ICD-10-CM | POA: Diagnosis not present

## 2023-07-01 DIAGNOSIS — L659 Nonscarring hair loss, unspecified: Secondary | ICD-10-CM | POA: Diagnosis not present

## 2023-07-01 DIAGNOSIS — M2550 Pain in unspecified joint: Secondary | ICD-10-CM | POA: Diagnosis not present

## 2023-07-14 DIAGNOSIS — A6920 Lyme disease, unspecified: Secondary | ICD-10-CM | POA: Insufficient documentation

## 2023-08-11 ENCOUNTER — Encounter: Payer: Self-pay | Admitting: Physician Assistant

## 2023-08-13 DIAGNOSIS — N924 Excessive bleeding in the premenopausal period: Secondary | ICD-10-CM | POA: Diagnosis not present

## 2023-08-13 DIAGNOSIS — N920 Excessive and frequent menstruation with regular cycle: Secondary | ICD-10-CM | POA: Diagnosis not present

## 2023-08-13 DIAGNOSIS — E611 Iron deficiency: Secondary | ICD-10-CM | POA: Diagnosis not present

## 2023-08-13 DIAGNOSIS — E559 Vitamin D deficiency, unspecified: Secondary | ICD-10-CM | POA: Diagnosis not present

## 2023-08-13 DIAGNOSIS — R5383 Other fatigue: Secondary | ICD-10-CM | POA: Diagnosis not present

## 2023-08-24 ENCOUNTER — Encounter: Payer: Self-pay | Admitting: Physician Assistant

## 2023-08-25 NOTE — Telephone Encounter (Signed)
 Please see patient msg and concerns and advise

## 2023-09-05 ENCOUNTER — Ambulatory Visit (INDEPENDENT_AMBULATORY_CARE_PROVIDER_SITE_OTHER): Admitting: Physician Assistant

## 2023-09-05 VITALS — BP 96/62 | HR 100 | Temp 98.4°F | Ht 63.0 in | Wt 124.8 lb

## 2023-09-05 DIAGNOSIS — R599 Enlarged lymph nodes, unspecified: Secondary | ICD-10-CM | POA: Diagnosis not present

## 2023-09-05 DIAGNOSIS — A6920 Lyme disease, unspecified: Secondary | ICD-10-CM | POA: Diagnosis not present

## 2023-09-05 DIAGNOSIS — M25552 Pain in left hip: Secondary | ICD-10-CM

## 2023-09-05 DIAGNOSIS — R768 Other specified abnormal immunological findings in serum: Secondary | ICD-10-CM

## 2023-09-05 DIAGNOSIS — R790 Abnormal level of blood mineral: Secondary | ICD-10-CM | POA: Diagnosis not present

## 2023-09-05 DIAGNOSIS — M25551 Pain in right hip: Secondary | ICD-10-CM

## 2023-09-05 DIAGNOSIS — K581 Irritable bowel syndrome with constipation: Secondary | ICD-10-CM | POA: Insufficient documentation

## 2023-09-05 MED ORDER — MELOXICAM 7.5 MG PO TABS
7.5000 mg | ORAL_TABLET | Freq: Every day | ORAL | 0 refills | Status: DC
Start: 2023-09-05 — End: 2023-10-02

## 2023-09-05 NOTE — Progress Notes (Signed)
 Patient ID: Carol Brown, female    DOB: Jul 17, 2000, 23 y.o.   MRN: [id number]   Assessment & Plan:  Lyme disease  Pain of both hip joints  Lymph nodes enlarged  Low ferritin  Irritable bowel syndrome with constipation  Positive ANA (antinuclear antibody)  Other orders -     Meloxicam; Take 1 tablet (7.5 mg total) by mouth daily. Take with food.  Dispense: 30 tablet; Refill: 0      Assessment and Plan Assessment & Plan Lyme disease with chronic joint pain, lymphadenopathy, paresthesia, and cognitive symptoms Lyme disease confirmed with positive IgM, indicating an acute infection. Symptoms include chronic joint pain, lymphadenopathy in the groin and neck, paresthesia as pins and needles on the head, back, and stomach, and cognitive symptoms such as brain fog and difficulty with word recall. Joint pain and stiffness are significant, with variability in severity. Consideration of an underlying autoimmune condition due to family history. - Continue doxycycline for a total of 21 to 30 days - Consider referral to rheumatology for joint pain and stiffness if symptoms persist after Lyme treatment - Consider referral to infectious disease specialist in [place]  - Monitor symptoms and follow up with healthcare providers in [place]  - Meloxicam 7.5 mg Rx today to take as needed to help with pain and joint symptoms   Iron deficiency anemia Iron deficiency anemia with low ferritin levels, previously treated with iron infusions. Symptoms include hair loss and fatigue. Concern about recurrence of low iron levels as hair loss has resumed. Constipation with iron supplements due to IBS is a challenge. - Consider taking slow-release iron supplements or children's chewable vitamins with iron - Monitor iron levels, especially if symptoms of fatigue and hair loss persist  Chronic gastrointestinal symptoms with constipation and rectal bleeding due to internal hemorrhoids Chronic gastrointestinal  symptoms include constipation and rectal bleeding attributed to internal hemorrhoids. Previously evaluated by a GI specialist. Occasional rectal bleeding persists but is not alarming. - Follow up with GI specialist as scheduled  History of positive antinuclear antibody (ANA) with family history of autoimmune disease Positive ANA with a low titer, not currently indicative of an active autoimmune disease. Family history includes autoimmune conditions such as hypothyroidism and Graves' disease. Potential for autoimmune conditions considered given family history and symptoms, but current focus is on treating Lyme disease. - Consider further evaluation for autoimmune conditions if symptoms persist after Lyme treatment - Monitor for any new symptoms suggestive of autoimmune disease      No follow-ups on file.    Subjective:    Chief Complaint  Patient presents with   Medical Management of Chronic Issues    Pt seen today for lymph node swelling; pts lyme symptoms are getting worse even though she is on medication;     HPI Discussed the use of AI scribe software for clinical note transcription with the patient, who gave verbal consent to proceed.  History of Present Illness Carol Brown is a 23 year old female who presents with persistent lymphadenopathy and joint pain.  She has experienced swollen lymph nodes for approximately eight months, initially noticed in December. The swelling began in her groin, and she has also noticed swollen lymph nodes in her neck. The lymph nodes are described as small and persistent despite various evaluations.  In January, she began experiencing severe gastrointestinal issues, including stabbing abdominal pain and blood in her stool, which she did not report for three months. An endoscopy and colonoscopy were performed, revealing a Candida  infection in her throat.  She has a history of joint pain, with significant pain in her hips, knees, and elbows,  impacting her ability to walk to class. Her joints 'lock up' and cause severe discomfort, with sharp pains in her back and hips, and audible 'popping' and 'rubbing' sounds.  Systemic symptoms include random fevers, hair loss, and fatigue. Her iron levels were critically low, with a ferritin level of one, leading to two iron infusions which provided temporary relief. She also reports brain fog, pins and needles sensations, and worsening symptoms around her menstrual cycle.  She tested positive for Lyme disease on two occasions and is currently on doxycycline for 21 to 30 days, having been on the medication for two weeks. She manages potential gastrointestinal side effects by eating before taking the medication.  Her family history includes autoimmune conditions, with her mother having hypothyroidism or Graves' disease. She has been tested for autoimmune markers, with a positive ANA but low titer, and other tests returning negative.  She has experienced changes in her menstrual cycle, with increased clotting, and was prescribed medication to manage her menstrual flow, which may have contributed to her anemia. She also reports dental issues, with multiple cavities despite good oral hygiene.     Past Medical History:  Diagnosis Date   Asthma     No past surgical history on file.  No family history on file.  Social History   Tobacco Use   Smoking status: Never   Smokeless tobacco: Never  Substance Use Topics   Alcohol use: Never   Drug use: Never     Allergies  Allergen Reactions   Dextromethorphan Other (See Comments)    Review of Systems NEGATIVE UNLESS OTHERWISE INDICATED IN HPI      Objective:     BP 96/62 (BP Location: Right Arm, Patient Position: Sitting, Cuff Size: Normal)   Pulse 100   Temp 98.4 F (36.9 C) (Temporal)   Ht 5' 3 (1.6 m)   Wt 124 lb 12.8 oz (56.6 kg)   SpO2 98%   BMI 22.11 kg/m   Wt Readings from Last 3 Encounters:  09/05/23 124 lb 12.8 oz  (56.6 kg)  01/27/23 117 lb 9.6 oz (53.3 kg)  01/20/23 119 lb (54 kg)    BP Readings from Last 3 Encounters:  09/05/23 96/62  01/27/23 106/68  01/20/23 108/68     Physical Exam Vitals and nursing note reviewed.  Constitutional:      Appearance: Normal appearance. She is normal weight. She is not toxic-appearing.  HENT:     Head: Normocephalic and atraumatic.     Right Ear: Tympanic membrane, ear canal and external ear normal.     Left Ear: Tympanic membrane, ear canal and external ear normal.     Nose: Nose normal.     Mouth/Throat:     Mouth: Mucous membranes are moist.  Eyes:     Extraocular Movements: Extraocular movements intact.     Conjunctiva/sclera: Conjunctivae normal.     Pupils: Pupils are equal, round, and reactive to light.  Cardiovascular:     Rate and Rhythm: Normal rate and regular rhythm.     Pulses: Normal pulses.     Heart sounds: Normal heart sounds.  Pulmonary:     Effort: Pulmonary effort is normal.     Breath sounds: Normal breath sounds.  Abdominal:     General: Abdomen is flat. Bowel sounds are normal.     Palpations: Abdomen is soft.  Musculoskeletal:  General: Normal range of motion.     Cervical back: Normal range of motion and neck supple.  Lymphadenopathy:     Comments: Few small mobile posterior cervical lymph nodes & inguinal lymph nodes   Skin:    General: Skin is warm and dry.     Findings: No lesion or rash.  Neurological:     General: No focal deficit present.     Mental Status: She is alert and oriented to person, place, and time.  Psychiatric:        Mood and Affect: Mood normal.        Behavior: Behavior normal.        Thought Content: Thought content normal.        Judgment: Judgment normal.             Andrew Soria M Lavaris Sexson, PA-C

## 2023-09-09 DIAGNOSIS — L578 Other skin changes due to chronic exposure to nonionizing radiation: Secondary | ICD-10-CM | POA: Diagnosis not present

## 2023-09-09 DIAGNOSIS — D2261 Melanocytic nevi of right upper limb, including shoulder: Secondary | ICD-10-CM | POA: Diagnosis not present

## 2023-09-09 DIAGNOSIS — D225 Melanocytic nevi of trunk: Secondary | ICD-10-CM | POA: Diagnosis not present

## 2023-09-09 DIAGNOSIS — L710 Perioral dermatitis: Secondary | ICD-10-CM | POA: Diagnosis not present

## 2023-09-17 DIAGNOSIS — R1013 Epigastric pain: Secondary | ICD-10-CM | POA: Diagnosis not present

## 2023-09-17 DIAGNOSIS — R935 Abnormal findings on diagnostic imaging of other abdominal regions, including retroperitoneum: Secondary | ICD-10-CM | POA: Diagnosis not present

## 2023-09-17 DIAGNOSIS — R630 Anorexia: Secondary | ICD-10-CM | POA: Diagnosis not present

## 2023-09-17 DIAGNOSIS — K921 Melena: Secondary | ICD-10-CM | POA: Diagnosis not present

## 2023-09-18 DIAGNOSIS — L049 Acute lymphadenitis, unspecified: Secondary | ICD-10-CM | POA: Diagnosis not present

## 2023-09-18 DIAGNOSIS — N644 Mastodynia: Secondary | ICD-10-CM | POA: Diagnosis not present

## 2023-09-18 DIAGNOSIS — R222 Localized swelling, mass and lump, trunk: Secondary | ICD-10-CM | POA: Diagnosis not present

## 2023-09-29 DIAGNOSIS — R935 Abnormal findings on diagnostic imaging of other abdominal regions, including retroperitoneum: Secondary | ICD-10-CM | POA: Diagnosis not present

## 2023-09-29 DIAGNOSIS — R109 Unspecified abdominal pain: Secondary | ICD-10-CM | POA: Diagnosis not present

## 2023-10-02 ENCOUNTER — Other Ambulatory Visit: Payer: Self-pay | Admitting: Physician Assistant

## 2023-10-02 DIAGNOSIS — M25551 Pain in right hip: Secondary | ICD-10-CM

## 2023-10-15 DIAGNOSIS — M2550 Pain in unspecified joint: Secondary | ICD-10-CM | POA: Diagnosis not present

## 2023-10-15 DIAGNOSIS — J45909 Unspecified asthma, uncomplicated: Secondary | ICD-10-CM | POA: Diagnosis not present

## 2023-10-15 DIAGNOSIS — Z811 Family history of alcohol abuse and dependence: Secondary | ICD-10-CM | POA: Diagnosis not present

## 2023-10-15 DIAGNOSIS — M25551 Pain in right hip: Secondary | ICD-10-CM | POA: Diagnosis not present

## 2023-10-15 DIAGNOSIS — I519 Heart disease, unspecified: Secondary | ICD-10-CM | POA: Diagnosis not present

## 2023-10-15 DIAGNOSIS — K589 Irritable bowel syndrome without diarrhea: Secondary | ICD-10-CM | POA: Diagnosis not present

## 2023-10-15 DIAGNOSIS — I7300 Raynaud's syndrome without gangrene: Secondary | ICD-10-CM | POA: Diagnosis not present

## 2023-10-15 DIAGNOSIS — M25552 Pain in left hip: Secondary | ICD-10-CM | POA: Diagnosis not present

## 2023-10-15 DIAGNOSIS — M79642 Pain in left hand: Secondary | ICD-10-CM | POA: Diagnosis not present

## 2023-10-15 DIAGNOSIS — M79641 Pain in right hand: Secondary | ICD-10-CM | POA: Diagnosis not present

## 2023-10-27 DIAGNOSIS — Z0000 Encounter for general adult medical examination without abnormal findings: Secondary | ICD-10-CM | POA: Diagnosis not present

## 2023-10-27 DIAGNOSIS — M25551 Pain in right hip: Secondary | ICD-10-CM | POA: Diagnosis not present

## 2023-10-27 DIAGNOSIS — M79642 Pain in left hand: Secondary | ICD-10-CM | POA: Diagnosis not present

## 2023-10-27 DIAGNOSIS — I7300 Raynaud's syndrome without gangrene: Secondary | ICD-10-CM | POA: Diagnosis not present

## 2023-10-27 DIAGNOSIS — M2550 Pain in unspecified joint: Secondary | ICD-10-CM | POA: Diagnosis not present

## 2023-10-27 DIAGNOSIS — M79641 Pain in right hand: Secondary | ICD-10-CM | POA: Diagnosis not present

## 2023-10-27 DIAGNOSIS — M25552 Pain in left hip: Secondary | ICD-10-CM | POA: Diagnosis not present

## 2023-11-10 DIAGNOSIS — N920 Excessive and frequent menstruation with regular cycle: Secondary | ICD-10-CM | POA: Diagnosis not present

## 2023-11-10 DIAGNOSIS — E611 Iron deficiency: Secondary | ICD-10-CM | POA: Diagnosis not present

## 2023-11-10 DIAGNOSIS — R5383 Other fatigue: Secondary | ICD-10-CM | POA: Diagnosis not present

## 2023-11-10 DIAGNOSIS — E559 Vitamin D deficiency, unspecified: Secondary | ICD-10-CM | POA: Diagnosis not present

## 2023-11-11 DIAGNOSIS — Z202 Contact with and (suspected) exposure to infections with a predominantly sexual mode of transmission: Secondary | ICD-10-CM | POA: Diagnosis not present

## 2023-11-17 DIAGNOSIS — R5383 Other fatigue: Secondary | ICD-10-CM | POA: Diagnosis not present

## 2023-11-17 DIAGNOSIS — N920 Excessive and frequent menstruation with regular cycle: Secondary | ICD-10-CM | POA: Diagnosis not present

## 2023-11-17 DIAGNOSIS — E611 Iron deficiency: Secondary | ICD-10-CM | POA: Diagnosis not present

## 2023-11-17 DIAGNOSIS — M2550 Pain in unspecified joint: Secondary | ICD-10-CM | POA: Diagnosis not present

## 2023-11-26 DIAGNOSIS — M2550 Pain in unspecified joint: Secondary | ICD-10-CM | POA: Diagnosis not present

## 2023-11-26 DIAGNOSIS — M79641 Pain in right hand: Secondary | ICD-10-CM | POA: Diagnosis not present

## 2023-11-26 DIAGNOSIS — M79642 Pain in left hand: Secondary | ICD-10-CM | POA: Diagnosis not present

## 2023-11-26 DIAGNOSIS — I7300 Raynaud's syndrome without gangrene: Secondary | ICD-10-CM | POA: Diagnosis not present

## 2023-11-26 DIAGNOSIS — M25551 Pain in right hip: Secondary | ICD-10-CM | POA: Diagnosis not present

## 2023-12-02 DIAGNOSIS — H6501 Acute serous otitis media, right ear: Secondary | ICD-10-CM | POA: Diagnosis not present

## 2023-12-30 DIAGNOSIS — J3089 Other allergic rhinitis: Secondary | ICD-10-CM | POA: Diagnosis not present

## 2023-12-30 DIAGNOSIS — J45990 Exercise induced bronchospasm: Secondary | ICD-10-CM | POA: Diagnosis not present

## 2023-12-30 DIAGNOSIS — J301 Allergic rhinitis due to pollen: Secondary | ICD-10-CM | POA: Diagnosis not present

## 2023-12-30 DIAGNOSIS — J3081 Allergic rhinitis due to animal (cat) (dog) hair and dander: Secondary | ICD-10-CM | POA: Diagnosis not present

## 2024-02-03 ENCOUNTER — Encounter: Payer: BC Managed Care – PPO | Admitting: Physician Assistant

## 2024-02-03 VITALS — BP 102/64 | HR 87 | Temp 98.2°F | Ht 63.39 in | Wt 125.8 lb

## 2024-02-03 DIAGNOSIS — Z0000 Encounter for general adult medical examination without abnormal findings: Secondary | ICD-10-CM

## 2024-02-03 DIAGNOSIS — R7689 Other specified abnormal immunological findings in serum: Secondary | ICD-10-CM | POA: Diagnosis not present

## 2024-02-03 DIAGNOSIS — K581 Irritable bowel syndrome with constipation: Secondary | ICD-10-CM

## 2024-02-03 DIAGNOSIS — M25552 Pain in left hip: Secondary | ICD-10-CM

## 2024-02-03 DIAGNOSIS — M25551 Pain in right hip: Secondary | ICD-10-CM | POA: Diagnosis not present

## 2024-02-03 DIAGNOSIS — D225 Melanocytic nevi of trunk: Secondary | ICD-10-CM | POA: Insufficient documentation

## 2024-02-03 DIAGNOSIS — L578 Other skin changes due to chronic exposure to nonionizing radiation: Secondary | ICD-10-CM | POA: Insufficient documentation

## 2024-02-03 DIAGNOSIS — D2261 Melanocytic nevi of right upper limb, including shoulder: Secondary | ICD-10-CM | POA: Insufficient documentation

## 2024-02-03 DIAGNOSIS — D2370 Other benign neoplasm of skin of unspecified lower limb, including hip: Secondary | ICD-10-CM | POA: Insufficient documentation

## 2024-02-03 MED ORDER — MELOXICAM 7.5 MG PO TABS: 7.5000 mg | ORAL_TABLET | Freq: Every day | ORAL | 2 refills | Status: AC

## 2024-02-03 NOTE — Progress Notes (Signed)
 "   Patient ID: Carol Brown, female    DOB: 06/15/2000, 23 y.o.   MRN: [id number]   Assessment & Plan:  Annual physical exam  Positive ANA (antinuclear antibody) -     Ambulatory referral to Rheumatology  Irritable bowel syndrome with constipation -     Ambulatory referral to Gastroenterology    Assessment & Plan Irritable bowel syndrome with constipation IBS with constipation managed with dietary modifications, including gluten and dairy avoidance. Occasional sharp abdominal pain, previously evaluated with a CT scan showing normal gallbladder and spleen. Pain is intermittent and not currently severe. - Continue dietary modifications avoiding gluten and dairy. - Monitor abdominal pain and report if it becomes more frequent or severe. - Will consider HIDA scan if pain persists or worsens to evaluate gallbladder function.  Positive antinuclear antibody Positive ANA with no current symptoms or diagnosis of autoimmune disease. Rheumatology follow-up in [place]  with plans to transfer care to [place]  after May 2025. - Placed referral for rheumatology in [place]  to facilitate transfer of care after May 2025.  General Health Maintenance Annual physical exam conducted. Pap smear up to date as of 2024. No new skin issues; recent dermatology check was normal. No smoking, vaping, or significant alcohol use. Regular exercise with training for a half marathon. No current need for additional blood work. - Continue regular exercise and training for half marathon. - Maintain current lifestyle and health habits. - Consider flu vaccination in the future if concerns about previous adverse reaction are addressed.  Patient Counseling: [x]   Nutrition: Stressed importance of moderation in sodium/caffeine intake, saturated fat and cholesterol, caloric balance, sufficient intake of fresh fruits, vegetables, and fiber.  [x]   Stressed the importance of regular exercise.   [x]   Substance Abuse:  Discussed cessation/primary prevention of tobacco, alcohol, or other drug use; driving or other dangerous activities under the influence; availability of treatment for abuse.   [x]   Injury prevention: Discussed safety belts, safety helmets, smoke detector, smoking near bedding or upholstery.   [x]   Sexuality: Discussed sexually transmitted diseases, partner selection, use of condoms, avoidance of unintended pregnancy  and contraceptive alternatives.   [x]   Dental health: Discussed importance of regular tooth brushing, flossing, and dental visits.  [x]   Health maintenance and immunizations reviewed. Please refer to Health maintenance section.      Return in about 1 year (around 02/02/2025) for physical.    Subjective:    Chief Complaint  Patient presents with   Annual Exam    Pt in office for annual CPE and fasting labs;     HPI Discussed the use of AI scribe software for clinical note transcription with the patient, who gave verbal consent to proceed.  History of Present Illness Carol Brown is a 23 year old female who presents for her annual physical exam.  She is currently training for a half marathon scheduled in February and has been experiencing some soreness from running. She is on a break from school.  She has a history of a positive ANA and is under rheumatology care, with the condition being monitored over time.  She experiences sharp, intermittent stomach pains that have been evaluated with a CT scan earlier this year, which showed no abnormalities. She uses oxygen occasionally for severe stomach pain. She avoids gluten and dairy due to stomach issues, which has been beneficial for her. She has a history of IBS and frequently visits a gastroenterologist for management of her symptoms.  Her medication regimen includes an  acid reducer for menstrual periods, an inhaler for running, a prescribed allergy pill for outdoor activities, and fluconazole as needed for yeast  infections. She is not currently on birth control and is not sexually active. She has been experiencing heavy menstrual bleeding and severe PMS symptoms, including nausea on the second day of her period, which are being monitored by her gynecologist with annual check-ups and ultrasounds.  She does not smoke or vape but consumes alcohol up to twice a week. She has not received a flu shot this year due to previous adverse reactions. Her skin has been checked recently with no concerns, and she uses sunscreen regularly.     Past Medical History:  Diagnosis Date   Allergy    Anemia    Asthma    History of Lyme disease     No past surgical history on file.  Family History  Problem Relation Age of Onset   Arthritis Mother    Hypertension Father    Arthritis Father    Cancer Paternal Grandfather    Diabetes Paternal Grandfather    Cancer Paternal Grandmother    ADD / ADHD Brother    ADD / ADHD Brother    Cancer Paternal Uncle     Social History[1]   Allergies[2]  Review of Systems NEGATIVE UNLESS OTHERWISE INDICATED IN HPI      Objective:     BP 102/64 (BP Location: Left Arm, Patient Position: Sitting, Cuff Size: Normal)   Pulse 87   Temp 98.2 F (36.8 C) (Temporal)   Ht 5' 3.39 (1.61 m)   Wt 125 lb 12.8 oz (57.1 kg)   LMP 01/31/2024 (Exact Date)   SpO2 100%   BMI 22.01 kg/m   Wt Readings from Last 3 Encounters:  02/03/24 125 lb 12.8 oz (57.1 kg)  09/05/23 124 lb 12.8 oz (56.6 kg)  01/27/23 117 lb 9.6 oz (53.3 kg)    BP Readings from Last 3 Encounters:  02/03/24 102/64  09/05/23 96/62  01/27/23 106/68     Physical Exam Vitals and nursing note reviewed.  Constitutional:      Appearance: Normal appearance. She is normal weight. She is not toxic-appearing.  HENT:     Head: Normocephalic and atraumatic.     Right Ear: Tympanic membrane, ear canal and external ear normal.     Left Ear: Tympanic membrane, ear canal and external ear normal.     Nose: Nose  normal.     Mouth/Throat:     Mouth: Mucous membranes are moist.  Eyes:     Extraocular Movements: Extraocular movements intact.     Conjunctiva/sclera: Conjunctivae normal.     Pupils: Pupils are equal, round, and reactive to light.  Cardiovascular:     Rate and Rhythm: Normal rate and regular rhythm.     Pulses: Normal pulses.     Heart sounds: Normal heart sounds.  Pulmonary:     Effort: Pulmonary effort is normal.     Breath sounds: Normal breath sounds.  Abdominal:     General: Abdomen is flat. Bowel sounds are normal.     Palpations: Abdomen is soft.  Musculoskeletal:        General: Normal range of motion.     Cervical back: Normal range of motion and neck supple.  Skin:    General: Skin is warm and dry.  Neurological:     General: No focal deficit present.     Mental Status: She is alert and oriented to person, place, and  time.  Psychiatric:        Mood and Affect: Mood normal.        Behavior: Behavior normal.        Thought Content: Thought content normal.        Judgment: Judgment normal.             Breelle Hollywood M Alexsa Flaum, PA-C    [1]  Social History Tobacco Use   Smoking status: Never   Smokeless tobacco: Never  Substance Use Topics   Alcohol use: Yes   Drug use: Never  [2]  Allergies Allergen Reactions   Pollen Extract Cough and Shortness Of Breath   Dextromethorphan Other (See Comments)    Other Reaction(s): Other (See Comments)   Dust Mite Extract     Other Reaction(s): respiratory distress   Tree Extract Cough    Other Reaction(s): respiratory distress   "

## 2024-07-05 ENCOUNTER — Ambulatory Visit

## 2025-02-07 ENCOUNTER — Encounter: Admitting: Physician Assistant
